# Patient Record
Sex: Female | Born: 1970
Health system: Southern US, Community
[De-identification: ages and names within clinical notes are randomized; demographics above are authoritative.]

## PROBLEM LIST (undated history)

## (undated) DIAGNOSIS — K8020 Calculus of gallbladder without cholecystitis without obstruction: Secondary | ICD-10-CM

## (undated) DIAGNOSIS — Z973 Presence of spectacles and contact lenses: Secondary | ICD-10-CM

## (undated) DIAGNOSIS — K8050 Calculus of bile duct without cholangitis or cholecystitis without obstruction: Secondary | ICD-10-CM

---
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
## 1996-05-11 HISTORY — PX: APPENDECTOMY: SHX54

## 1999-01-03 ENCOUNTER — Other Ambulatory Visit: Admission: RE | Admit: 1999-01-03 | Discharge: 1999-01-03 | Payer: Self-pay | Admitting: Obstetrics and Gynecology

## 2000-10-15 ENCOUNTER — Other Ambulatory Visit: Admission: RE | Admit: 2000-10-15 | Discharge: 2000-10-15 | Payer: Self-pay | Admitting: Obstetrics and Gynecology

## 2001-11-08 ENCOUNTER — Other Ambulatory Visit: Admission: RE | Admit: 2001-11-08 | Discharge: 2001-11-08 | Payer: Self-pay | Admitting: Obstetrics and Gynecology

## 2003-02-27 ENCOUNTER — Other Ambulatory Visit: Admission: RE | Admit: 2003-02-27 | Discharge: 2003-02-27 | Payer: Self-pay | Admitting: Obstetrics and Gynecology

## 2003-07-02 ENCOUNTER — Emergency Department (HOSPITAL_COMMUNITY): Admission: EM | Admit: 2003-07-02 | Discharge: 2003-07-02 | Payer: Self-pay | Admitting: Family Medicine

## 2003-08-09 ENCOUNTER — Emergency Department (HOSPITAL_COMMUNITY): Admission: EM | Admit: 2003-08-09 | Discharge: 2003-08-09 | Payer: Self-pay | Admitting: Family Medicine

## 2003-09-13 ENCOUNTER — Emergency Department (HOSPITAL_COMMUNITY): Admission: EM | Admit: 2003-09-13 | Discharge: 2003-09-13 | Payer: Self-pay | Admitting: Family Medicine

## 2004-01-08 ENCOUNTER — Emergency Department (HOSPITAL_COMMUNITY): Admission: EM | Admit: 2004-01-08 | Discharge: 2004-01-08 | Payer: Self-pay | Admitting: Family Medicine

## 2004-03-13 ENCOUNTER — Other Ambulatory Visit: Admission: RE | Admit: 2004-03-13 | Discharge: 2004-03-13 | Payer: Self-pay | Admitting: Obstetrics and Gynecology

## 2004-10-20 ENCOUNTER — Emergency Department (HOSPITAL_COMMUNITY): Admission: EM | Admit: 2004-10-20 | Discharge: 2004-10-20 | Payer: Self-pay | Admitting: Emergency Medicine

## 2005-02-19 ENCOUNTER — Inpatient Hospital Stay (HOSPITAL_COMMUNITY): Admission: AD | Admit: 2005-02-19 | Discharge: 2005-02-19 | Payer: Self-pay | Admitting: Obstetrics and Gynecology

## 2005-04-15 ENCOUNTER — Inpatient Hospital Stay (HOSPITAL_COMMUNITY): Admission: AD | Admit: 2005-04-15 | Discharge: 2005-04-17 | Payer: Self-pay | Admitting: Obstetrics and Gynecology

## 2005-04-18 ENCOUNTER — Encounter: Admission: RE | Admit: 2005-04-18 | Discharge: 2005-05-18 | Payer: Self-pay | Admitting: Obstetrics and Gynecology

## 2005-05-21 ENCOUNTER — Other Ambulatory Visit: Admission: RE | Admit: 2005-05-21 | Discharge: 2005-05-21 | Payer: Self-pay | Admitting: Obstetrics and Gynecology

## 2005-11-20 ENCOUNTER — Emergency Department (HOSPITAL_COMMUNITY): Admission: EM | Admit: 2005-11-20 | Discharge: 2005-11-20 | Payer: Self-pay | Admitting: Family Medicine

## 2006-03-29 ENCOUNTER — Emergency Department (HOSPITAL_COMMUNITY): Admission: EM | Admit: 2006-03-29 | Discharge: 2006-03-29 | Payer: Self-pay | Admitting: Family Medicine

## 2006-04-19 ENCOUNTER — Emergency Department (HOSPITAL_COMMUNITY): Admission: EM | Admit: 2006-04-19 | Discharge: 2006-04-19 | Payer: Self-pay | Admitting: Family Medicine

## 2007-01-14 ENCOUNTER — Emergency Department (HOSPITAL_COMMUNITY): Admission: EM | Admit: 2007-01-14 | Discharge: 2007-01-14 | Payer: Self-pay | Admitting: Family Medicine

## 2007-06-10 ENCOUNTER — Emergency Department (HOSPITAL_COMMUNITY): Admission: EM | Admit: 2007-06-10 | Discharge: 2007-06-10 | Payer: Self-pay | Admitting: Family Medicine

## 2009-08-12 ENCOUNTER — Emergency Department (HOSPITAL_COMMUNITY): Admission: EM | Admit: 2009-08-12 | Discharge: 2009-08-12 | Payer: Self-pay | Admitting: Emergency Medicine

## 2010-07-30 LAB — POCT RAPID STREP A (OFFICE): Streptococcus, Group A Screen (Direct): NEGATIVE

## 2010-09-26 NOTE — Op Note (Signed)
NAMEMARGERY, SZOSTAK NO.:  [id number]   MEDICAL RECORD NO.:  [id number]          PATIENT TYPE:  INP   LOCATION:  9114                          FACILITY:  [facility]   PHYSICIAN:  Juluis Mire, M.D.   DATE OF BIRTH:  1971/04/08   DATE OF PROCEDURE:  04/15/2005  DATE OF DISCHARGE:                                 OPERATIVE REPORT   DELIVERY NOTE:  The patient progressed to second stage.  She had a three-hour second stage  with the fetal vertex station at the perineum.  You can see the head with  pushing.  It was in the OA presentation.  The infant was having increasing  variable decelerations with slow return.  Decision was made to proceed with  the vacuum extractor assisted vaginal delivery.  Risks were discussed  including the risk of hematoma formation, possibly requiring transfusion,  rare risk of intracranial bleeds.  The bladder was emptied by  catheterization.  Kiwi vacuum extractor was put in place.  With the next  contraction, it was brought to appropriate pressure.  With that contraction,  we did have one pop-off.  With the next contraction, the VE was reapplied.  Appropriate suction obtained.  We brought the head down to the crowning  position.  The VE was removed.  The patient subsequently spontaneously  delivered the vertex.  The infant was a viable female with Apgars of 8 and 9.  PH is pending.  There was no episiotomy or tear.  Placenta was delivered  intact.  There was a three-vessel cord.  Estimated blood loss was 400 mL.  Mother did have an epidural in place. Mother and baby were doing well  postpartum.      Juluis Mire, M.D.  Electronically Signed     JSM/MEDQ  D:  04/15/2005  T:  04/16/2005  Job:  [id number]

## 2011-01-23 ENCOUNTER — Other Ambulatory Visit: Payer: Self-pay | Admitting: Obstetrics and Gynecology

## 2011-01-23 DIAGNOSIS — R928 Other abnormal and inconclusive findings on diagnostic imaging of breast: Secondary | ICD-10-CM

## 2011-01-29 LAB — POCT RAPID STREP A: Streptococcus, Group A Screen (Direct): NEGATIVE

## 2011-02-02 ENCOUNTER — Ambulatory Visit
Admission: RE | Admit: 2011-02-02 | Discharge: 2011-02-02 | Disposition: A | Discharge status: Home / Self Care | Payer: 59 | Source: Ambulatory Visit | Attending: Obstetrics and Gynecology | Admitting: Obstetrics and Gynecology

## 2011-02-02 DIAGNOSIS — R928 Other abnormal and inconclusive findings on diagnostic imaging of breast: Secondary | ICD-10-CM

## 2011-12-03 ENCOUNTER — Other Ambulatory Visit: Payer: Self-pay | Admitting: Obstetrics and Gynecology

## 2011-12-03 DIAGNOSIS — N644 Mastodynia: Secondary | ICD-10-CM

## 2011-12-07 ENCOUNTER — Ambulatory Visit
Admission: RE | Admit: 2011-12-07 | Discharge: 2011-12-07 | Disposition: A | Discharge status: Home / Self Care | Payer: 59 | Source: Ambulatory Visit | Attending: Obstetrics and Gynecology | Admitting: Obstetrics and Gynecology

## 2011-12-07 DIAGNOSIS — N644 Mastodynia: Secondary | ICD-10-CM

## 2014-04-10 ENCOUNTER — Other Ambulatory Visit: Payer: Self-pay | Admitting: Obstetrics and Gynecology

## 2014-04-10 DIAGNOSIS — R928 Other abnormal and inconclusive findings on diagnostic imaging of breast: Secondary | ICD-10-CM

## 2014-04-25 ENCOUNTER — Ambulatory Visit
Admission: RE | Admit: 2014-04-25 | Discharge: 2014-04-25 | Disposition: A | Discharge status: Home / Self Care | Payer: 59 | Source: Ambulatory Visit | Attending: Obstetrics and Gynecology | Admitting: Obstetrics and Gynecology

## 2014-04-25 ENCOUNTER — Other Ambulatory Visit: Payer: Self-pay | Admitting: Obstetrics and Gynecology

## 2014-04-25 DIAGNOSIS — R928 Other abnormal and inconclusive findings on diagnostic imaging of breast: Secondary | ICD-10-CM

## 2015-02-20 ENCOUNTER — Other Ambulatory Visit: Payer: Self-pay

## 2015-02-20 DIAGNOSIS — Z1231 Encounter for screening mammogram for malignant neoplasm of breast: Secondary | ICD-10-CM

## 2015-04-03 ENCOUNTER — Ambulatory Visit
Admission: RE | Admit: 2015-04-03 | Discharge: 2015-04-03 | Disposition: A | Discharge status: Home / Self Care | Payer: 59 | Source: Ambulatory Visit

## 2015-04-03 DIAGNOSIS — Z1231 Encounter for screening mammogram for malignant neoplasm of breast: Secondary | ICD-10-CM

## 2015-05-17 DIAGNOSIS — Z6822 Body mass index (BMI) 22.0-22.9, adult: Secondary | ICD-10-CM | POA: Diagnosis not present

## 2015-05-17 DIAGNOSIS — Z1212 Encounter for screening for malignant neoplasm of rectum: Secondary | ICD-10-CM | POA: Diagnosis not present

## 2015-05-17 DIAGNOSIS — Z01419 Encounter for gynecological examination (general) (routine) without abnormal findings: Secondary | ICD-10-CM | POA: Diagnosis not present

## 2016-01-23 DIAGNOSIS — Z131 Encounter for screening for diabetes mellitus: Secondary | ICD-10-CM | POA: Diagnosis not present

## 2016-01-23 DIAGNOSIS — Z1329 Encounter for screening for other suspected endocrine disorder: Secondary | ICD-10-CM | POA: Diagnosis not present

## 2016-01-23 DIAGNOSIS — Z1321 Encounter for screening for nutritional disorder: Secondary | ICD-10-CM | POA: Diagnosis not present

## 2016-01-23 DIAGNOSIS — Z13220 Encounter for screening for lipoid disorders: Secondary | ICD-10-CM | POA: Diagnosis not present

## 2016-03-31 ENCOUNTER — Other Ambulatory Visit: Payer: Self-pay | Admitting: Obstetrics and Gynecology

## 2016-03-31 DIAGNOSIS — Z1231 Encounter for screening mammogram for malignant neoplasm of breast: Secondary | ICD-10-CM

## 2016-04-10 ENCOUNTER — Ambulatory Visit (INDEPENDENT_AMBULATORY_CARE_PROVIDER_SITE_OTHER): Payer: 59 | Admitting: Physician Assistant

## 2016-04-10 ENCOUNTER — Encounter: Payer: Self-pay | Admitting: Physician Assistant

## 2016-04-10 ENCOUNTER — Other Ambulatory Visit (INDEPENDENT_AMBULATORY_CARE_PROVIDER_SITE_OTHER): Payer: 59

## 2016-04-10 ENCOUNTER — Encounter (INDEPENDENT_AMBULATORY_CARE_PROVIDER_SITE_OTHER): Payer: Self-pay

## 2016-04-10 VITALS — BP 116/68 | HR 62 | Ht 66.0 in | Wt 144.0 lb

## 2016-04-10 DIAGNOSIS — R1011 Right upper quadrant pain: Secondary | ICD-10-CM | POA: Diagnosis not present

## 2016-04-10 LAB — CBC WITH DIFFERENTIAL/PLATELET
Basophils Absolute: 0 10*3/uL (ref 0.0–0.1)
Basophils Relative: 0.3 % (ref 0.0–3.0)
Eosinophils Absolute: 0.2 10*3/uL (ref 0.0–0.7)
Eosinophils Relative: 2 % (ref 0.0–5.0)
HCT: 41.3 % (ref 36.0–46.0)
Hemoglobin: 14.3 g/dL (ref 12.0–15.0)
Lymphocytes Relative: 27.9 % (ref 12.0–46.0)
Lymphs Abs: 2.3 10*3/uL (ref 0.7–4.0)
MCHC: 34.6 g/dL (ref 30.0–36.0)
MCV: 91.8 fl (ref 78.0–100.0)
Monocytes Absolute: 0.5 10*3/uL (ref 0.1–1.0)
Monocytes Relative: 5.9 % (ref 3.0–12.0)
Neutro Abs: 5.3 10*3/uL (ref 1.4–7.7)
Neutrophils Relative %: 63.9 % (ref 43.0–77.0)
Platelets: 196 10*3/uL (ref 150.0–400.0)
RBC: 4.49 Mil/uL (ref 3.87–5.11)
RDW: 12.6 % (ref 11.5–15.5)
WBC: 8.3 10*3/uL (ref 4.0–10.5)

## 2016-04-10 LAB — COMPREHENSIVE METABOLIC PANEL
ALT: 19 U/L (ref 0–35)
AST: 14 U/L (ref 0–37)
Albumin: 4 g/dL (ref 3.5–5.2)
Alkaline Phosphatase: 54 U/L (ref 39–117)
BUN: 18 mg/dL (ref 6–23)
CO2: 26 mEq/L (ref 19–32)
Calcium: 9.4 mg/dL (ref 8.4–10.5)
Chloride: 105 mEq/L (ref 96–112)
Creatinine, Ser: 0.68 mg/dL (ref 0.40–1.20)
GFR: 98.38 mL/min (ref >60.00)
Glucose, Bld: 88 mg/dL (ref 70–99)
Potassium: 3.8 mEq/L (ref 3.5–5.1)
Sodium: 139 mEq/L (ref 135–145)
Total Bilirubin: 0.4 mg/dL (ref 0.2–1.2)
Total Protein: 7 g/dL (ref 6.0–8.3)

## 2016-04-10 LAB — LIPASE: Lipase: 19 U/L (ref 11.0–59.0)

## 2016-04-10 NOTE — Progress Notes (Signed)
Chief Complaint: Intermittent RUQ Pain  HPI:  Mrs. Si Gaul is a 45 year old Caucasian female who presents to clinic today accompanied by her husband. She tells me that she followed with Dr. Olevia Perches in the past, "many years ago". She tells me that she does not have a lot of medical problems and "isn't a complainer", but has been suffering from intermittent right upper quadrant pain now on 4 separate occasions over the past 4 months. The patient tells me initially this occurred 4 months ago and woke her up in the middle the night around 2 AM. She describes this was a right upper quadrant pain initially rated as a 6-7/ 10 which after an hour or so started to radiate into her back and up her right shoulder blade. This was very uncomfortable, the patient tried to reposition but that this did not change anything. She tried taking ibuprofen but this also did not help. This lasted for 2-3 hours and gradually went away and she went back to sleep. This occurred again about 2 months ago in the middle of the night, exactly the same as the first episode. This then occurred a week ago Saturday in the middle of the night and then again on Friday night. Friday night's episode was slightly different than previous. The patient tells me this started around 9 PM while she was still awake. They had celebrated thanksgiving that day so she had quite a lot to eat in the afternoon, but this was around 1 PM. The patient again tried ibuprofen which did not help. She then tried taking some ranitidine which did not help. Her husband began to research on the Internet what this could be and thought it could be gallbladder. He read that apple cider vinegar and a heating pad may help. The patient drank a glass of her sons apple juice and placed a heating pad on her right side and laid on that side and after about 15-20 minutes this pain went away. Patient describes this is a 6-7 /10 pain which is nagging/cramping in nature. They are both  concerned that she is having "gallbladder attacks".   Patient denies fever, chills, blood in her stool, melena, change in diet, weight loss, fatigue, anorexia, nausea, vomiting, heartburn, reflux or increase in gas or bloating.  Past Medical History: none  Past Surgical History:  Procedure Laterality Date  . APPENDECTOMY  1998  . VAGINAL DELIVERY    . WISDOM TOOTH EXTRACTION  1985    Current Outpatient Prescriptions  Medication Sig Dispense Refill  . Ascorbic Acid (VITAMIN C) 1000 MG tablet Take 1,000 mg by mouth daily.    . calcium-vitamin D (OSCAL WITH D) 250-125 MG-UNIT tablet Take 1 tablet by mouth daily.    Marland Kitchen glucosamine-chondroitin 500-400 MG tablet Take 1 tablet by mouth daily.    . norethindrone-ethinyl estradiol (JUNEL FE,GILDESS FE,LOESTRIN FE) 1-20 MG-MCG tablet Take 1 tablet by mouth daily.    . Potassium Gluconate 550 (90 K) MG TABS Take 1 capsule by mouth daily.    . vitamin E 400 UNIT capsule Take 400 Units by mouth daily.     No current facility-administered medications for this visit.     Allergies as of 04/10/2016  . (No Known Allergies)    Family History  Problem Relation Age of Onset  . Adopted: Yes    Social History   Social History  . Marital status: Married    Spouse name: N/A  . Number of children: N/A  . Years of  education: N/A   Occupational History  . [employer] employee    Social History Main Topics  . Smoking status: Never Smoker  . Smokeless tobacco: Never Used  . Alcohol use Yes     Comment: socially  . Drug use: No  . Sexual activity: Not on file   Other Topics Concern  . Not on file   Social History Narrative  . No narrative on file    Review of Systems:     Constitutional: No weight loss, fever, chills, weakness or fatigue HEENT: Eyes: No change in vision               Ears, Nose, Throat:  No change in hearing  Skin: No rash  Cardiovascular: No chest pain Respiratory: No SOB  Gastrointestinal: See HPI and otherwise  negative Neurological: No headache Musculoskeletal: Chronic back pain No new muscle or joint pain Hematologic: No bleeding  Psychiatric: No history of depression or anxiety   Physical Exam:  Vital signs: BP 116/68   Pulse 62   Ht 5\' 6"  (1.676 m)   Wt 144 lb (65.3 kg)   BMI 23.24 kg/m   Constitutional:   Pleasant Caucasian female appears to be in NAD, Well developed, Well nourished, alert and cooperative Head:  Normocephalic and atraumatic. Eyes:   PEERL, EOMI. No icterus. Conjunctiva pink. Ears:  Normal auditory acuity. Neck:  Supple Throat: Oral cavity and pharynx without inflammation, swelling or lesion.  Respiratory: Respirations even and unlabored. Lungs clear to auscultation bilaterally.   No wheezes, crackles, or rhonchi.  Cardiovascular: Normal S1, S2. No MRG. Regular rate and rhythm. No peripheral edema, cyanosis or pallor.  Gastrointestinal:  Soft, nondistended, nontender. No rebound or guarding. Normal bowel sounds. No appreciable masses or hepatomegaly. Rectal:  Not performed.  Msk:  Symmetrical without gross deformities. Without edema, no deformity or joint abnormality.  Neurologic:  Alert and  oriented x4;  grossly normal neurologically.  Skin:   Dry and intact without significant lesions or rashes. Psychiatric: Demonstrates good judgement and reason without abnormal affect or behaviors.   No RECENT LABS OR IMAGING:  Assessment: 1. Intermittent right upper quadrant pain: Patient describes 4 separate episodes which last 2-3 hours at a time of 7/10 right upper quadrant abdominal pain which will radiate up her right shoulder blade, does not seem to be brought on by eating, first 3 episodes woke the patient from sleep, the last one occurred at 9 PM after a Thanksgiving meal earlier that afternoon, no heartburn or reflux; consider gallbladder stones versus spasm versus biliary dyskinesia versus other  Plan: 1. Ordered right upper quadrant ultrasound. 2. Ordered labs to  include CBC, CMP and lipase 3. Discussed with the patient that she should eat small frequent meals throughout the day that are low in fat. 4. If the patient has another acute attack, she can try apple juice and a heating pad as this seemed to help her last time, though there is no true research behind this anecdote. 5. Advised the patient that if she has pain which lasted longer than normal or is more severe or has nausea or vomiting, would recommend that she proceed to the ER. 6. Patient will follow with Dr. Fuller Plan (as she requested him) or myself within the next 2-3 weeks or sooner if necessary.  Ellouise Newer, PA-C [facility] 04/10/2016, 3:18 PM

## 2016-04-10 NOTE — Patient Instructions (Signed)
You have been scheduled for an abdominal ultrasound at [facility] [address] [place], [place] [id number]   Make certain not to have anything to eat or drink 6 hours prior to your appointment. Should you need to reschedule your appointment, please contact radiology at [phone]. This test typically takes about 30 minutes to perform.  Your physician has requested that you go to the basement for the following lab work before leaving today:

## 2016-04-10 NOTE — Progress Notes (Signed)
Reviewed and agree with initial management plan.  Aaliyah Cancro T. Heath Tesler, MD FACG 

## 2016-04-11 ENCOUNTER — Encounter (HOSPITAL_BASED_OUTPATIENT_CLINIC_OR_DEPARTMENT_OTHER): Payer: Self-pay | Admitting: Radiology

## 2016-04-11 ENCOUNTER — Ambulatory Visit (HOSPITAL_BASED_OUTPATIENT_CLINIC_OR_DEPARTMENT_OTHER)
Admission: RE | Admit: 2016-04-11 | Discharge: 2016-04-11 | Disposition: A | Discharge status: Home / Self Care | Payer: 59 | Source: Ambulatory Visit | Attending: Physician Assistant | Admitting: Physician Assistant

## 2016-04-11 DIAGNOSIS — K8020 Calculus of gallbladder without cholecystitis without obstruction: Secondary | ICD-10-CM | POA: Insufficient documentation

## 2016-04-11 DIAGNOSIS — R1011 Right upper quadrant pain: Secondary | ICD-10-CM | POA: Diagnosis not present

## 2016-04-21 DIAGNOSIS — H524 Presbyopia: Secondary | ICD-10-CM | POA: Diagnosis not present

## 2016-04-21 DIAGNOSIS — H5213 Myopia, bilateral: Secondary | ICD-10-CM | POA: Diagnosis not present

## 2016-04-24 ENCOUNTER — Ambulatory Visit: Payer: Self-pay | Admitting: General Surgery

## 2016-04-24 DIAGNOSIS — K8020 Calculus of gallbladder without cholecystitis without obstruction: Secondary | ICD-10-CM | POA: Diagnosis not present

## 2016-05-07 ENCOUNTER — Encounter (HOSPITAL_BASED_OUTPATIENT_CLINIC_OR_DEPARTMENT_OTHER): Payer: Self-pay | Admitting: *Deleted

## 2016-05-08 ENCOUNTER — Ambulatory Visit: Payer: Self-pay | Admitting: General Surgery

## 2016-05-08 ENCOUNTER — Ambulatory Visit: Payer: 59

## 2016-05-08 ENCOUNTER — Encounter (HOSPITAL_BASED_OUTPATIENT_CLINIC_OR_DEPARTMENT_OTHER): Payer: Self-pay | Admitting: *Deleted

## 2016-05-08 NOTE — Progress Notes (Signed)
NPO AFTER MN W/ EXCEPTION CLEAR LIQUIDS UNTIL 0700 (NO CREAM/ MILK PRODUCTS).  ARRIVE AT 1100.  NEEDS HG AND URINE PREG.  PRE-OP ORDERS PENDING.  WILL TAKE AM MED DOS W/ SIPS OF WATER.  WILL DO HIBICLENS SHOWER  HS BEFORE AND AM DOS.

## 2016-05-15 ENCOUNTER — Ambulatory Visit (HOSPITAL_BASED_OUTPATIENT_CLINIC_OR_DEPARTMENT_OTHER): Payer: 59 | Admitting: Anesthesiology

## 2016-05-15 ENCOUNTER — Ambulatory Visit (HOSPITAL_BASED_OUTPATIENT_CLINIC_OR_DEPARTMENT_OTHER)
Admission: RE | Admit: 2016-05-15 | Discharge: 2016-05-15 | Disposition: A | Discharge status: Home / Self Care | Payer: 59 | Source: Ambulatory Visit | Attending: General Surgery | Admitting: General Surgery

## 2016-05-15 ENCOUNTER — Encounter (HOSPITAL_BASED_OUTPATIENT_CLINIC_OR_DEPARTMENT_OTHER): Payer: Self-pay

## 2016-05-15 ENCOUNTER — Encounter (HOSPITAL_BASED_OUTPATIENT_CLINIC_OR_DEPARTMENT_OTHER)
Admission: RE | Disposition: A | Discharge status: Home / Self Care | Payer: Self-pay | Source: Ambulatory Visit | Attending: General Surgery

## 2016-05-15 DIAGNOSIS — K8010 Calculus of gallbladder with chronic cholecystitis without obstruction: Secondary | ICD-10-CM | POA: Diagnosis not present

## 2016-05-15 DIAGNOSIS — Z79899 Other long term (current) drug therapy: Secondary | ICD-10-CM | POA: Diagnosis not present

## 2016-05-15 DIAGNOSIS — K811 Chronic cholecystitis: Secondary | ICD-10-CM | POA: Diagnosis not present

## 2016-05-15 HISTORY — PX: CHOLECYSTECTOMY: SHX55

## 2016-05-15 HISTORY — DX: Calculus of gallbladder without cholecystitis without obstruction: K80.20

## 2016-05-15 HISTORY — DX: Presence of spectacles and contact lenses: Z97.3

## 2016-05-15 HISTORY — DX: Calculus of bile duct without cholangitis or cholecystitis without obstruction: K80.50

## 2016-05-15 LAB — CBC WITH DIFFERENTIAL/PLATELET
BASOS ABS: 0 10*3/uL (ref 0.0–0.1)
Basophils Relative: 0 %
Eosinophils Absolute: 0.2 10*3/uL (ref 0.0–0.7)
Eosinophils Relative: 3 %
HEMATOCRIT: 42 % (ref 36.0–46.0)
Hemoglobin: 14.3 g/dL (ref 12.0–15.0)
LYMPHS PCT: 26 %
Lymphs Abs: 1.7 10*3/uL (ref 0.7–4.0)
MCH: 31 pg (ref 26.0–34.0)
MCHC: 34 g/dL (ref 30.0–36.0)
MCV: 90.9 fL (ref 78.0–100.0)
Monocytes Absolute: 0.5 10*3/uL (ref 0.1–1.0)
Monocytes Relative: 8 %
NEUTROS ABS: 4.2 10*3/uL (ref 1.7–7.7)
NEUTROS PCT: 63 %
Platelets: 215 10*3/uL (ref 150–400)
RBC: 4.62 MIL/uL (ref 3.87–5.11)
RDW: 12.7 % (ref 11.5–15.5)
WBC: 6.7 10*3/uL (ref 4.0–10.5)

## 2016-05-15 LAB — COMPREHENSIVE METABOLIC PANEL
ALBUMIN: 3.8 g/dL (ref 3.5–5.0)
ALT: 22 U/L (ref 14–54)
AST: 23 U/L (ref 15–41)
Alkaline Phosphatase: 55 U/L (ref 38–126)
Anion gap: 7 (ref 5–15)
BILIRUBIN TOTAL: 0.6 mg/dL (ref 0.3–1.2)
BUN: 16 mg/dL (ref 6–20)
CHLORIDE: 108 mmol/L (ref 101–111)
CO2: 23 mmol/L (ref 22–32)
Calcium: 9 mg/dL (ref 8.9–10.3)
Creatinine, Ser: 0.66 mg/dL (ref 0.44–1.00)
GFR calc Af Amer: >60 mL/min (ref >60)
GLUCOSE: 89 mg/dL (ref 65–99)
Potassium: 3.7 mmol/L (ref 3.5–5.1)
Sodium: 138 mmol/L (ref 135–145)
TOTAL PROTEIN: 7.2 g/dL (ref 6.5–8.1)

## 2016-05-15 LAB — POCT PREGNANCY, URINE: PREG TEST UR: NEGATIVE

## 2016-05-15 SURGERY — LAPAROSCOPIC CHOLECYSTECTOMY
Anesthesia: General | Site: Abdomen

## 2016-05-15 MED ORDER — MEPERIDINE HCL 25 MG/ML IJ SOLN
6.2500 mg | INTRAMUSCULAR | Status: DC | PRN
  Filled 2016-05-15: qty 1

## 2016-05-15 MED ORDER — DEXAMETHASONE SODIUM PHOSPHATE 4 MG/ML IJ SOLN
INTRAMUSCULAR | Status: DC | PRN
  Administered 2016-05-15: 10 mg via INTRAVENOUS

## 2016-05-15 MED ORDER — IBUPROFEN 800 MG PO TABS: 800.0000 mg | ORAL_TABLET | Freq: Three times a day (TID) | ORAL | 0 refills | Status: AC | PRN

## 2016-05-15 MED ORDER — METOCLOPRAMIDE HCL 5 MG/ML IJ SOLN
10.0000 mg | Freq: Once | INTRAMUSCULAR | Status: DC | PRN
  Filled 2016-05-15: qty 2

## 2016-05-15 MED ORDER — ACETAMINOPHEN 500 MG PO TABS
ORAL_TABLET | ORAL | Status: AC
  Filled 2016-05-15: qty 2

## 2016-05-15 MED ORDER — GABAPENTIN 300 MG PO CAPS
ORAL_CAPSULE | ORAL | Status: AC
  Filled 2016-05-15: qty 1

## 2016-05-15 MED ORDER — HYDROCODONE-ACETAMINOPHEN 5-325 MG PO TABS: 1.0000 | ORAL_TABLET | Freq: Four times a day (QID) | ORAL | 0 refills | Status: DC | PRN

## 2016-05-15 MED ORDER — KETOROLAC TROMETHAMINE 30 MG/ML IJ SOLN
INTRAMUSCULAR | Status: AC
  Filled 2016-05-15: qty 1

## 2016-05-15 MED ORDER — MIDAZOLAM HCL 2 MG/2ML IJ SOLN
INTRAMUSCULAR | Status: AC
  Filled 2016-05-15: qty 2

## 2016-05-15 MED ORDER — ROCURONIUM BROMIDE 50 MG/5ML IV SOSY
PREFILLED_SYRINGE | INTRAVENOUS | Status: AC
  Filled 2016-05-15: qty 5

## 2016-05-15 MED ORDER — SUGAMMADEX SODIUM 200 MG/2ML IV SOLN
INTRAVENOUS | Status: AC
  Filled 2016-05-15: qty 2

## 2016-05-15 MED ORDER — METOCLOPRAMIDE HCL 5 MG/ML IJ SOLN
INTRAMUSCULAR | Status: AC
  Filled 2016-05-15: qty 2

## 2016-05-15 MED ORDER — FENTANYL CITRATE (PF) 100 MCG/2ML IJ SOLN
INTRAMUSCULAR | Status: AC
  Filled 2016-05-15: qty 4

## 2016-05-15 MED ORDER — CELECOXIB 400 MG PO CAPS
400.0000 mg | ORAL_CAPSULE | ORAL | Status: AC
  Administered 2016-05-15: 400 mg via ORAL
  Filled 2016-05-15: qty 1

## 2016-05-15 MED ORDER — ACETAMINOPHEN 500 MG PO TABS
1000.0000 mg | ORAL_TABLET | ORAL | Status: AC
  Administered 2016-05-15: 1000 mg via ORAL
  Filled 2016-05-15: qty 2

## 2016-05-15 MED ORDER — PROPOFOL 10 MG/ML IV BOLUS
INTRAVENOUS | Status: DC | PRN
  Administered 2016-05-15: 170 mg via INTRAVENOUS

## 2016-05-15 MED ORDER — DEXAMETHASONE SODIUM PHOSPHATE 10 MG/ML IJ SOLN
INTRAMUSCULAR | Status: AC
Start: 2016-05-15 — End: 2016-05-15
  Filled 2016-05-15: qty 1

## 2016-05-15 MED ORDER — SUGAMMADEX SODIUM 200 MG/2ML IV SOLN
INTRAVENOUS | Status: DC | PRN
Start: 2016-05-15 — End: 2016-05-15
  Administered 2016-05-15: 100 mg via INTRAVENOUS
  Administered 2016-05-15: 200 mg via INTRAVENOUS

## 2016-05-15 MED ORDER — SCOPOLAMINE 1 MG/3DAYS TD PT72
1.0000 | MEDICATED_PATCH | TRANSDERMAL | Status: DC
  Administered 2016-05-15: 1.5 mg via TRANSDERMAL
  Filled 2016-05-15: qty 1

## 2016-05-15 MED ORDER — HYDROMORPHONE HCL 1 MG/ML IJ SOLN
0.2500 mg | INTRAMUSCULAR | Status: DC | PRN
  Filled 2016-05-15: qty 0.5

## 2016-05-15 MED ORDER — FENTANYL CITRATE (PF) 100 MCG/2ML IJ SOLN
INTRAMUSCULAR | Status: DC | PRN
  Administered 2016-05-15: 100 ug via INTRAVENOUS

## 2016-05-15 MED ORDER — CHLORHEXIDINE GLUCONATE CLOTH 2 % EX PADS
6.0000 | MEDICATED_PAD | Freq: Once | CUTANEOUS | Status: DC
  Filled 2016-05-15: qty 6

## 2016-05-15 MED ORDER — ONDANSETRON HCL 4 MG/2ML IJ SOLN
INTRAMUSCULAR | Status: AC
  Filled 2016-05-15: qty 2

## 2016-05-15 MED ORDER — LACTATED RINGERS IV SOLN
INTRAVENOUS | Status: DC
  Administered 2016-05-15 (×2): via INTRAVENOUS
  Filled 2016-05-15: qty 1000

## 2016-05-15 MED ORDER — CEFOTETAN DISODIUM-DEXTROSE 2-2.08 GM-% IV SOLR
INTRAVENOUS | Status: AC
  Filled 2016-05-15: qty 50

## 2016-05-15 MED ORDER — METOCLOPRAMIDE HCL 5 MG/ML IJ SOLN
INTRAMUSCULAR | Status: DC | PRN
  Administered 2016-05-15: 10 mg via INTRAVENOUS

## 2016-05-15 MED ORDER — MIDAZOLAM HCL 5 MG/5ML IJ SOLN
INTRAMUSCULAR | Status: DC | PRN
  Administered 2016-05-15: 2 mg via INTRAVENOUS

## 2016-05-15 MED ORDER — LIDOCAINE 2% (20 MG/ML) 5 ML SYRINGE
INTRAMUSCULAR | Status: DC | PRN
  Administered 2016-05-15: 100 mg via INTRAVENOUS

## 2016-05-15 MED ORDER — LIDOCAINE 2% (20 MG/ML) 5 ML SYRINGE
INTRAMUSCULAR | Status: AC
  Filled 2016-05-15: qty 5

## 2016-05-15 MED ORDER — GABAPENTIN 300 MG PO CAPS
300.0000 mg | ORAL_CAPSULE | ORAL | Status: AC
  Administered 2016-05-15: 300 mg via ORAL
  Filled 2016-05-15: qty 1

## 2016-05-15 MED ORDER — PROPOFOL 10 MG/ML IV BOLUS
INTRAVENOUS | Status: AC
  Filled 2016-05-15: qty 40

## 2016-05-15 MED ORDER — ONDANSETRON HCL 4 MG/2ML IJ SOLN
INTRAMUSCULAR | Status: DC | PRN
  Administered 2016-05-15: 4 mg via INTRAVENOUS

## 2016-05-15 MED ORDER — SCOPOLAMINE 1 MG/3DAYS TD PT72
MEDICATED_PATCH | TRANSDERMAL | Status: AC
  Filled 2016-05-15: qty 1

## 2016-05-15 MED ORDER — HYDROCODONE-ACETAMINOPHEN 7.5-325 MG PO TABS
ORAL_TABLET | ORAL | Status: AC
  Filled 2016-05-15: qty 1

## 2016-05-15 MED ORDER — DEXTROSE 5 % IV SOLN
2.0000 g | INTRAVENOUS | Status: AC
  Administered 2016-05-15: 2 g via INTRAVENOUS
  Filled 2016-05-15: qty 2

## 2016-05-15 MED ORDER — HYDROCODONE-ACETAMINOPHEN 7.5-325 MG PO TABS
1.0000 | ORAL_TABLET | Freq: Once | ORAL | Status: AC | PRN
  Administered 2016-05-15: 1 via ORAL
  Filled 2016-05-15: qty 1

## 2016-05-15 MED ORDER — ROCURONIUM BROMIDE 50 MG/5ML IV SOSY
PREFILLED_SYRINGE | INTRAVENOUS | Status: DC | PRN
  Administered 2016-05-15: 40 mg via INTRAVENOUS

## 2016-05-15 MED ORDER — BUPIVACAINE-EPINEPHRINE 0.25% -1:200000 IJ SOLN
INTRAMUSCULAR | Status: DC | PRN
  Administered 2016-05-15: 30 mL

## 2016-05-15 MED ORDER — CELECOXIB 200 MG PO CAPS
ORAL_CAPSULE | ORAL | Status: AC
  Filled 2016-05-15: qty 2

## 2016-05-15 SURGICAL SUPPLY — 54 items
ADH SKN CLS APL DERMABOND .7 (GAUZE/BANDAGES/DRESSINGS) ×1
APPLIER CLIP ROT 10 11.4 M/L (STAPLE)
APR CLP MED LRG 11.4X10 (STAPLE)
BAG SPEC RTRVL 10 TROC 200 (ENDOMECHANICALS) ×1
BLADE SURG 11 STRL SS (BLADE) ×2 IMPLANT
CATH CHOLANG 76X19 KUMAR (CATHETERS) IMPLANT
CHLORAPREP W/TINT 26ML (MISCELLANEOUS) ×3 IMPLANT
CLIP APPLIE ROT 10 11.4 M/L (STAPLE) IMPLANT
CLIP LIGATING HEM O LOK PURPLE (MISCELLANEOUS) ×2 IMPLANT
CLIP LIGATING HEMO LOK XL GOLD (MISCELLANEOUS) IMPLANT
COVER BACK TABLE 60X90IN (DRAPES) ×2 IMPLANT
COVER MAYO STAND STRL (DRAPES) ×2 IMPLANT
DECANTER SPIKE VIAL GLASS SM (MISCELLANEOUS) ×2 IMPLANT
DERMABOND ADVANCED (GAUZE/BANDAGES/DRESSINGS) ×1
DERMABOND ADVANCED .7 DNX12 (GAUZE/BANDAGES/DRESSINGS) ×1 IMPLANT
DEVICE PMI PUNCTURE CLOSURE (MISCELLANEOUS) ×1 IMPLANT
DRAIN CHANNEL 19F RND (DRAIN) IMPLANT
DRAPE C-ARM 42X72 X-RAY (DRAPES) IMPLANT
DRAPE LAPAROSCOPIC ABDOMINAL (DRAPES) ×2 IMPLANT
DRAPE UTILITY XL STRL (DRAPES) ×2 IMPLANT
ELECT REM PT RETURN 9FT ADLT (ELECTROSURGICAL) ×2
ELECTRODE REM PT RTRN 9FT ADLT (ELECTROSURGICAL) ×1 IMPLANT
EVACUATOR SILICONE 100CC (DRAIN) IMPLANT
GLOVE BIO SURGEON STRL SZ 6.5 (GLOVE) ×1 IMPLANT
GLOVE BIOGEL PI IND STRL 6.5 (GLOVE) IMPLANT
GLOVE BIOGEL PI IND STRL 7.0 (GLOVE) ×1 IMPLANT
GLOVE BIOGEL PI INDICATOR 6.5 (GLOVE) ×1
GLOVE BIOGEL PI INDICATOR 7.0 (GLOVE) ×1
GLOVE SURG SS PI 7.0 STRL IVOR (GLOVE) ×2 IMPLANT
GOWN STRL REUS W/TWL LRG LVL3 (GOWN DISPOSABLE) ×6 IMPLANT
HEMOSTAT SNOW SURGICEL 2X4 (HEMOSTASIS) IMPLANT
IV NS 1000ML (IV SOLUTION) ×4
IV NS 1000ML BAXH (IV SOLUTION) ×2 IMPLANT
NEEDLE HYPO 22GX1.5 SAFETY (NEEDLE) ×2 IMPLANT
PACK BASIN DAY SURGERY FS (CUSTOM PROCEDURE TRAY) ×2 IMPLANT
POUCH RETRIEVAL ECOSAC 10 (ENDOMECHANICALS) ×1 IMPLANT
POUCH RETRIEVAL ECOSAC 10MM (ENDOMECHANICALS) ×1
RELOAD STAPLE 45 3.5 BLU ETS (ENDOMECHANICALS) IMPLANT
RELOAD STAPLE TA45 3.5 REG BLU (ENDOMECHANICALS) IMPLANT
SCISSORS LAP 5X35 DISP (ENDOMECHANICALS) IMPLANT
SET IRRIG TUBING LAPAROSCOPIC (IRRIGATION / IRRIGATOR) IMPLANT
SHEARS HARMONIC ACE PLUS 36CM (ENDOMECHANICALS) IMPLANT
SOLUTION ANTI FOG 6CC (MISCELLANEOUS) ×2 IMPLANT
STOPCOCK 4 WAY LG BORE MALE ST (IV SETS) IMPLANT
SUT ETHILON 2 0 PS N (SUTURE) IMPLANT
SUT MNCRL AB 4-0 PS2 18 (SUTURE) ×2 IMPLANT
SUT VICRYL 0 ENDOLOOP (SUTURE) IMPLANT
SUT VICRYL 0 UR6 27IN ABS (SUTURE) ×2 IMPLANT
SYR 20CC LL (SYRINGE) IMPLANT
SYR CONTROL 10ML LL (SYRINGE) ×2 IMPLANT
TOWEL OR 17X24 6PK STRL BLUE (TOWEL DISPOSABLE) ×4 IMPLANT
TROCAR BLADELESS OPT 5 100 (ENDOMECHANICALS) ×6 IMPLANT
TROCAR XCEL 12X100 BLDLESS (ENDOMECHANICALS) ×2 IMPLANT
TUBING INSUF HEATED (TUBING) ×2 IMPLANT

## 2016-05-15 NOTE — Transfer of Care (Signed)
  Last Vitals:  Vitals:   05/15/16 1113 05/15/16 1420  BP: (!) 143/91 (!) (P) 138/92  Pulse: 81   Resp: 16   Temp: 36.8 C (P) 36.6 C    Last Pain:  Vitals:   05/15/16 1113  TempSrc: Oral      Patients Stated Pain Goal: 5 (05/15/16 1149)  Immediate Anesthesia Transfer of Care Note  Patient: Linda Vance  Procedure(s) Performed: Procedure(s) (LRB): LAPAROSCOPIC CHOLECYSTECTOMY (N/A)  Patient Location: PACU  Anesthesia Type: General  Level of Consciousness: awake, alert  and oriented  Airway & Oxygen Therapy: Patient Spontanous Breathing and Patient connected to nasal cannula  oxygen  Post-op Assessment: Report given to PACU RN and Post -op Vital signs reviewed and stable  Post vital signs: Reviewed and stable  Complications: No apparent anesthesia complications

## 2016-05-15 NOTE — Anesthesia Procedure Notes (Signed)
Procedure Name: Intubation Date/Time: 05/15/2016 1:11 PM Performed by: Bethena Roys T Pre-anesthesia Checklist: Patient identified, Emergency Drugs available, Suction available and Patient being monitored Patient Re-evaluated:Patient Re-evaluated prior to inductionOxygen Delivery Method: Circle system utilized Preoxygenation: Pre-oxygenation with 100% oxygen Intubation Type: IV induction Ventilation: Mask ventilation without difficulty Laryngoscope Size: Mac and 3 Grade View: Grade I Tube type: Oral Number of attempts: 1 Airway Equipment and Method: Stylet and Oral airway Placement Confirmation: ETT inserted through vocal cords under direct vision,  positive ETCO2 and breath sounds checked- equal and bilateral Secured at: 21 cm Tube secured with: Tape Dental Injury: Teeth and Oropharynx as per pre-operative assessment

## 2016-05-15 NOTE — Anesthesia Preprocedure Evaluation (Addendum)
Anesthesia Evaluation  Patient identified by MRN, date of birth, ID band Patient awake    Reviewed: Allergy & Precautions, NPO status , Patient's Chart, lab work & pertinent test results  Airway Mallampati: II  TM Distance: >3 FB Neck ROM: Full    Dental no notable dental hx. (+) Teeth Intact   Pulmonary neg pulmonary ROS,    Pulmonary exam normal breath sounds clear to auscultation       Cardiovascular negative cardio ROS Normal cardiovascular exam Rhythm:Regular Rate:Normal     Neuro/Psych negative neurological ROS  negative psych ROS   GI/Hepatic Neg liver ROS, Cholelithiasis Biliary colic   Endo/Other  negative endocrine ROS  Renal/GU negative Renal ROS  negative genitourinary   Musculoskeletal negative musculoskeletal ROS (+)   Abdominal   Peds  Hematology negative hematology ROS (+)   Anesthesia Other Findings   Reproductive/Obstetrics negative OB ROS                            Anesthesia Physical Anesthesia Plan  ASA: I  Anesthesia Plan: General   Post-op Pain Management:    Induction: Intravenous  Airway Management Planned: Oral ETT  Additional Equipment:   Intra-op Plan:   Post-operative Plan: Extubation in OR  Informed Consent: I have reviewed the patients History and Physical, chart, labs and discussed the procedure including the risks, benefits and alternatives for the proposed anesthesia with the patient or authorized representative who has indicated his/her understanding and acceptance.   Dental advisory given  Plan Discussed with: CRNA, Anesthesiologist and Surgeon  Anesthesia Plan Comments:        Anesthesia Quick Evaluation

## 2016-05-15 NOTE — H&P (Signed)
Linda Vance is an 46 y.o. female.   Chief Complaint: biliary colic HPI: 46 yo female with epigastric pain consistent with gallbladder disease.  Past Medical History:  Diagnosis Date  . Biliary colic   . Cholelithiasis   . Wears contact lenses     Past Surgical History:  Procedure Laterality Date  . APPENDECTOMY  1998  . WISDOM TOOTH EXTRACTION  1985    Family History  Problem Relation Age of Onset  . Adopted: Yes   Social History:  reports that she has never smoked. She has never used smokeless tobacco. She reports that she drinks alcohol. She reports that she does not use drugs.  Allergies: No Known Allergies  Medications Prior to Admission  Medication Sig Dispense Refill  . Ascorbic Acid (VITAMIN C) 1000 MG tablet Take 1,000 mg by mouth daily.    . calcium-vitamin D (OSCAL WITH D) 250-125 MG-UNIT tablet Take 1 tablet by mouth daily.    Marland Kitchen glucosamine-chondroitin 500-400 MG tablet Take 1 tablet by mouth daily.    . norethindrone-ethinyl estradiol (JUNEL FE,GILDESS FE,LOESTRIN FE) 1-20 MG-MCG tablet Take 1 tablet by mouth every morning.     . Potassium Gluconate 550 (90 K) MG TABS Take 1 capsule by mouth daily.    . vitamin E 400 UNIT capsule Take 400 Units by mouth daily.      Results for orders placed or performed during the hospital encounter of 05/15/16 (from the past 48 hour(s))  Pregnancy, urine POC     Status: None   Collection Time: 05/15/16 11:57 AM  Result Value Ref Range   Preg Test, Ur NEGATIVE NEGATIVE    Comment:        THE SENSITIVITY OF THIS METHODOLOGY IS >24 mIU/mL   CBC WITH DIFFERENTIAL     Status: None   Collection Time: 05/15/16 12:00 PM  Result Value Ref Range   WBC 6.7 4.0 - 10.5 K/uL   RBC 4.62 3.87 - 5.11 MIL/uL   Hemoglobin 14.3 12.0 - 15.0 g/dL   HCT 42.0 36.0 - 46.0 %   MCV 90.9 78.0 - 100.0 fL   MCH 31.0 26.0 - 34.0 pg   MCHC 34.0 30.0 - 36.0 g/dL   RDW 12.7 11.5 - 15.5 %   Platelets 215 150 - 400 K/uL   Neutrophils Relative %  63 %   Neutro Abs 4.2 1.7 - 7.7 K/uL   Lymphocytes Relative 26 %   Lymphs Abs 1.7 0.7 - 4.0 K/uL   Monocytes Relative 8 %   Monocytes Absolute 0.5 0.1 - 1.0 K/uL   Eosinophils Relative 3 %   Eosinophils Absolute 0.2 0.0 - 0.7 K/uL   Basophils Relative 0 %   Basophils Absolute 0.0 0.0 - 0.1 K/uL  Comprehensive metabolic panel     Status: None   Collection Time: 05/15/16 12:00 PM  Result Value Ref Range   Sodium 138 135 - 145 mmol/L   Potassium 3.7 3.5 - 5.1 mmol/L   Chloride 108 101 - 111 mmol/L   CO2 23 22 - 32 mmol/L   Glucose, Bld 89 65 - 99 mg/dL   BUN 16 6 - 20 mg/dL   Creatinine, Ser 0.66 0.44 - 1.00 mg/dL   Calcium 9.0 8.9 - 10.3 mg/dL   Total Protein 7.2 6.5 - 8.1 g/dL   Albumin 3.8 3.5 - 5.0 g/dL   AST 23 15 - 41 U/L   ALT 22 14 - 54 U/L   Alkaline Phosphatase 55 38 -  126 U/L   Total Bilirubin 0.6 0.3 - 1.2 mg/dL   GFR calc non Af Amer >60 >60 mL/min   GFR calc Af Amer >60 >60 mL/min    Comment: (NOTE) The eGFR has been calculated using the CKD EPI equation. This calculation has not been validated in all clinical situations. eGFR's persistently <60 mL/min signify possible Chronic Kidney Disease.    Anion gap 7 5 - 15   No results found.  Review of Systems  Constitutional: Negative for chills and fever.  HENT: Negative for hearing loss.   Eyes: Negative for blurred vision and double vision.  Respiratory: Negative for cough and hemoptysis.   Cardiovascular: Negative for chest pain and palpitations.  Gastrointestinal: Negative for abdominal pain, nausea and vomiting.  Genitourinary: Negative for dysuria and urgency.  Musculoskeletal: Negative for myalgias and neck pain.  Skin: Negative for itching and rash.  Neurological: Negative for dizziness, tingling and headaches.  Endo/Heme/Allergies: Does not bruise/bleed easily.  Psychiatric/Behavioral: Negative for depression and suicidal ideas.    Blood pressure (!) 143/91, pulse 81, temperature 98.3 F (36.8 C),  temperature source Oral, resp. rate 16, height '5\' 6"'  (1.676 m), weight 64.9 kg (143 lb), last menstrual period 04/17/2016, SpO2 100 %. Physical Exam  Vitals reviewed. Constitutional: She is oriented to person, place, and time. She appears well-developed and well-nourished.  HENT:  Head: Normocephalic and atraumatic.  Eyes: Conjunctivae and EOM are normal. Pupils are equal, round, and reactive to light.  Neck: Normal range of motion. Neck supple.  Cardiovascular: Normal rate and regular rhythm.   Respiratory: Effort normal and breath sounds normal.  GI: Soft. Bowel sounds are normal. She exhibits no distension. There is no tenderness.  RUQ pain on exam  Musculoskeletal: Normal range of motion.  Neurological: She is alert and oriented to person, place, and time.  Skin: Skin is warm and dry.  Psychiatric: She has a normal mood and affect. Her behavior is normal.     Assessment/Plan 46 yo female with biliary colic -lap chole as outpatient  Mickeal Skinner, MD 05/15/2016, 1:01 PM

## 2016-05-15 NOTE — Anesthesia Postprocedure Evaluation (Signed)
Anesthesia Post Note  Patient: Linda Vance  Procedure(s) Performed: Procedure(s) (LRB): LAPAROSCOPIC CHOLECYSTECTOMY (N/A)  Patient location during evaluation: PACU Anesthesia Type: General Level of consciousness: sedated Pain management: pain level controlled Vital Signs Assessment: post-procedure vital signs reviewed and stable Respiratory status: spontaneous breathing and respiratory function stable Cardiovascular status: stable Anesthetic complications: no       Last Vitals:  Vitals:   05/15/16 1445 05/15/16 1515  BP: 126/87 120/83  Pulse: 60 (!) 53  Resp: 17 16  Temp:      Last Pain:  Vitals:   05/15/16 1420  TempSrc:   PainSc: Asleep                 Ersel Wadleigh DANIEL

## 2016-05-15 NOTE — Op Note (Signed)
PATIENT:  Linda Vance  46 y.o. female  PRE-OPERATIVE DIAGNOSIS:  chronic cholecystitis  POST-OPERATIVE DIAGNOSIS:  chronic cholecystitis  PROCEDURE:  Procedure(s): LAPAROSCOPIC CHOLECYSTECTOMY   SURGEON:  Surgeon(s): Arta Bruce Sakinah Rosamond, MD  ASSISTANT: none  ANESTHESIA:   local and general  Indications for procedure: [name] is a 46 y.o. female with symptoms of Abdominal pain and RUQ pain consistent with gallbladder disease, Confirmed by Ultrasound.  Description of procedure: The patient was brought into the operative suite, placed supine. Anesthesia was administered with endotracheal tube. Patient was strapped in place and foot board was secured. All pressure points were offloaded by foam padding. The patient was prepped and draped in the usual sterile fashion.  A small incision was made to the right of the umbilicus. A 41mm trocar was inserted into the peritoneal cavity with optical entry. Pneumoperitoneum was applied with high flow low pressure. 2 60mm trocars were placed in the RUQ. A 23mm trocar was placed in the subxiphoid space. All trocars sites were first anesthesized with 0.25% marcaine with epinephrine in the subcutaneous and preperitoneal layers. Next the patient was placed in reverse trendelenberg. The gallbladder had multiple adhesions to the omentum and duodenum, these were removed with blunt dissection and cautery.  The gallbladder was retracted cephalad and lateral. The peritoneum was reflected off the infundibulum working lateral to medial. The cystic duct and cystic artery were identified and further dissection revealed a critical view. The cystic duct and cystic artery were doubly clipped and ligated.   The gallbladder was removed off the liver bed with cautery. The Gallbladder was placed in a specimen bag. The gallbladder fossa was irrigated and hemostasis was applied with cautery. The gallbladder was removed via the 88mm trocar. The fascial defect was  closed with interrupted 0 vicryl suture via laparoscopic trans-fascial suture passer. Pneumoperitoneum was removed, all trocar were removed. All incisions were closed with 4-0 monocryl subcuticular stitch. The patient woke from anesthesia and was brought to PACU in stable condition. All counts were correct  Findings: inflamed gallbladder with 2 large stones  Specimen: gallbladder  Blood loss: Total I/O In: 700 [I.V.:700] Out: 10 [Blood:10] ml  Local anesthesia: 20 ml 0.25% marcaine with epinephrine  Complications: none  PLAN OF CARE: Discharge to home after PACU  PATIENT DISPOSITION:  PACU - hemodynamically stable.  Gurney Maxin, M.D. General, Bariatric, & Minimally Invasive Surgery [facility]

## 2016-05-15 NOTE — Discharge Instructions (Signed)
[place] After Refer to this sheet in the next few weeks. These instructions provide you with information about caring for yourself after your procedure. Your health care provider may also give you more specific instructions. Your treatment has been planned according to current medical practices, but problems sometimes occur. Call your health care provider if you have any problems or questions after your procedure. What can I expect after the procedure? After the procedure, it is common to have:  A sore throat.  Discomfort in your shoulder.  Mild discomfort or cramping in your abdomen.  Gas pains.  Pain or soreness in the area where the surgical cut (incision) was made.  A bloated feeling.  Tiredness.  Nausea.  Vomiting. Follow these instructions at home: Medicines  Take over-the-counter and prescription medicines only as told by your health care provider.  Do not take aspirin because it can cause bleeding.  Do not drive or operate heavy machinery while taking prescription pain medicine. Activity  Rest for the rest of the day.  Return to your normal activities as told by your health care provider. Ask your health care provider what activities are safe for you. Incision care  Follow instructions from your health care provider about how to take care of your incision. Make sure you:  Wash your hands with soap and water before you change your bandage (dressing). If soap and water are not available, use hand sanitizer.  Change your dressing as told by your health care provider.  Leave stitches (sutures) in place. They may need to stay in place for 2 weeks or longer.  Check your incision area every day for signs of infection. Check for:  More redness, swelling, or pain.  More fluid or blood.  Warmth.  Pus or a bad smell. Other Instructions  Do not take baths, swim, or use a hot tub until your health care provider approves. You may take showers.  Keep all  follow-up visits as told by your health care provider. This is important.  Have someone help you with your daily household tasks for the first few days. Contact a health care provider if:  You have more redness, swelling, or pain around your incision.  Your incision feels warm to the touch.  You have pus or a bad smell coming from your incision.  The edges of your incision break open after the sutures have been removed.  Your pain does not improve after 2-3 days.  You have a rash.  You repeatedly become dizzy or light-headed.  Your pain medicine is not helping.  You are constipated. Get help right away if:  You have a fever.  You faint.  You have increasing pain in your abdomen.  You have severe pain in one or both of your shoulders.  You have fluid or blood coming from your sutures or from your vagina.  You have shortness of breath or difficulty breathing.  You have chest pain or leg pain.  You have ongoing nausea, vomiting, or diarrhea. This information is not intended to replace advice given to you by your health care provider. Make sure you discuss any questions you have with your health care provider. Document Released: 11/14/2004 Document Revised: 09/30/2015 Document Reviewed: 04/07/2015 Elsevier Interactive Patient Education  2017 [facility] Instructions  Activity: Get plenty of rest for the remainder of the day. A responsible adult should stay with you for 24 hours following the procedure.  For the next 24 hours, DO NOT: -  Drive a car -Paediatric nurse -Drink alcoholic beverages -Take any medication unless instructed by your physician -Make any legal decisions or sign important papers.  Meals: Start with liquid foods such as gelatin or soup. Progress to regular foods as tolerated. Avoid greasy, spicy, heavy foods. If nausea and/or vomiting occur, drink only clear liquids until the nausea and/or vomiting subsides. Call your  physician if vomiting continues.  Special Instructions/Symptoms: Your throat may feel dry or sore from the anesthesia or the breathing tube placed in your throat during surgery. If this causes discomfort, gargle with warm salt water. The discomfort should disappear within 24 hours.  If you had a scopolamine patch placed behind your ear for the management of post- operative nausea and/or vomiting:  1. The medication in the patch is effective for 72 hours, after which it should be removed.  Wrap patch in a tissue and discard in the trash. Wash hands thoroughly with soap and water. 2. You may remove the patch earlier than 72 hours if you experience unpleasant side effects which may include dry mouth, dizziness or visual disturbances. 3. Avoid touching the patch. Wash your hands with soap and water after contact with the patch.

## 2016-05-18 ENCOUNTER — Encounter (HOSPITAL_BASED_OUTPATIENT_CLINIC_OR_DEPARTMENT_OTHER): Payer: Self-pay | Admitting: General Surgery

## 2016-05-29 ENCOUNTER — Ambulatory Visit: Payer: 59 | Admitting: Gastroenterology

## 2016-06-03 DIAGNOSIS — Z6823 Body mass index (BMI) 23.0-23.9, adult: Secondary | ICD-10-CM | POA: Diagnosis not present

## 2016-06-03 DIAGNOSIS — Z1321 Encounter for screening for nutritional disorder: Secondary | ICD-10-CM | POA: Diagnosis not present

## 2016-06-03 DIAGNOSIS — Z01419 Encounter for gynecological examination (general) (routine) without abnormal findings: Secondary | ICD-10-CM | POA: Diagnosis not present

## 2016-07-23 DIAGNOSIS — D2272 Melanocytic nevi of left lower limb, including hip: Secondary | ICD-10-CM | POA: Diagnosis not present

## 2016-07-23 DIAGNOSIS — L718 Other rosacea: Secondary | ICD-10-CM | POA: Diagnosis not present

## 2016-07-23 DIAGNOSIS — Z85828 Personal history of other malignant neoplasm of skin: Secondary | ICD-10-CM | POA: Diagnosis not present

## 2016-07-23 DIAGNOSIS — I788 Other diseases of capillaries: Secondary | ICD-10-CM | POA: Diagnosis not present

## 2016-07-23 DIAGNOSIS — D2239 Melanocytic nevi of other parts of face: Secondary | ICD-10-CM | POA: Diagnosis not present

## 2016-07-23 DIAGNOSIS — D1801 Hemangioma of skin and subcutaneous tissue: Secondary | ICD-10-CM | POA: Diagnosis not present

## 2016-07-23 DIAGNOSIS — D225 Melanocytic nevi of trunk: Secondary | ICD-10-CM | POA: Diagnosis not present

## 2016-07-23 DIAGNOSIS — L821 Other seborrheic keratosis: Secondary | ICD-10-CM | POA: Diagnosis not present

## 2016-07-23 DIAGNOSIS — L111 Transient acantholytic dermatosis [Grover]: Secondary | ICD-10-CM | POA: Diagnosis not present

## 2016-09-17 ENCOUNTER — Ambulatory Visit
Admission: RE | Admit: 2016-09-17 | Discharge: 2016-09-17 | Disposition: A | Discharge status: Home / Self Care | Payer: 59 | Source: Ambulatory Visit | Attending: Obstetrics and Gynecology | Admitting: Obstetrics and Gynecology

## 2016-09-17 DIAGNOSIS — Z1231 Encounter for screening mammogram for malignant neoplasm of breast: Secondary | ICD-10-CM

## 2016-09-18 ENCOUNTER — Other Ambulatory Visit: Payer: Self-pay | Admitting: Obstetrics and Gynecology

## 2016-09-18 DIAGNOSIS — R928 Other abnormal and inconclusive findings on diagnostic imaging of breast: Secondary | ICD-10-CM

## 2016-09-23 ENCOUNTER — Ambulatory Visit
Admission: RE | Admit: 2016-09-23 | Discharge: 2016-09-23 | Disposition: A | Discharge status: Home / Self Care | Payer: 59 | Source: Ambulatory Visit | Attending: Obstetrics and Gynecology | Admitting: Obstetrics and Gynecology

## 2016-09-23 DIAGNOSIS — R928 Other abnormal and inconclusive findings on diagnostic imaging of breast: Secondary | ICD-10-CM | POA: Diagnosis not present

## 2016-09-23 DIAGNOSIS — N6489 Other specified disorders of breast: Secondary | ICD-10-CM | POA: Diagnosis not present

## 2016-11-05 DIAGNOSIS — L820 Inflamed seborrheic keratosis: Secondary | ICD-10-CM | POA: Diagnosis not present

## 2016-11-05 DIAGNOSIS — D2339 Other benign neoplasm of skin of other parts of face: Secondary | ICD-10-CM | POA: Diagnosis not present

## 2016-11-05 DIAGNOSIS — Z85828 Personal history of other malignant neoplasm of skin: Secondary | ICD-10-CM | POA: Diagnosis not present

## 2017-03-29 ENCOUNTER — Encounter: Payer: 59 | Attending: Obstetrics and Gynecology | Admitting: Registered"

## 2017-03-29 ENCOUNTER — Encounter: Payer: Self-pay | Admitting: Registered"

## 2017-03-29 DIAGNOSIS — E639 Nutritional deficiency, unspecified: Secondary | ICD-10-CM | POA: Insufficient documentation

## 2017-03-29 NOTE — Progress Notes (Signed)
Medical Nutrition Therapy:  Appt start time: 9937 end time:  1205.   Assessment:  Primary concerns today: [employer] Employee. Pt reports she came primarily to receive her badge, but would also like to receive nutrition education. Pt reports she also has questions regarding how to encourage her 48 year old son to eat a wider variety of foods. Pt reports she does not like fruit and never has. She thinks it is mostly due to the texture of fruit.   Preferred Learning Style:   No preference indicated   Learning Readiness:   Ready  MEDICATIONS: See list.    DIETARY INTAKE:  Usual eating pattern includes 3 meals per day and maybe 1 snack per day. Meals eaten at home are eaten together as a family and without electronics present.   Everyday foods include 1 egg, 1 piece of toast, skim milk, Premier Protein shake with lunch when working.  Avoided foods include fruits due to texture, fish with the exception of shrimp, yogurt.    24-hr recall:  B ( AM): hot chocolate and crossiant from [employer] (usually has 1 egg, toast, skim milk for breakfast) Snk ( AM): None reported.  L ( PM): None reported.  Snk ( PM): Dr. Malachi Bonds  D ( 530-6 PM): spaghetti, skim milk  Snk ( PM): None reported.  Beverages: 2 cups skim milk, 1 soda, 32 oz of water  24-hr recall: Typical day  B ( AM): 1 egg, 1 piece white wheat toast, skim milk  Snk ( AM): Dr. Malachi Bonds L ( PM): sandwich, raw vegetables with dip, Premier Protein shake  Snk ( PM): None reported unless a piece of chocolate  D (PM): grilled chicken breast, salad, baked potato with butter and sometimes cheese (typical meal) Snk ( PM): None reported.  Beverages: 1 soda, 1 cup milk, Premier protein, typically 32 oz of water   Usual physical activity: 20-30 minutes of walking or other exercise at home around 3 days per week.   Progress Towards Goal(s):  In progress.   Nutritional Diagnosis:  NI-5.11.1 Predicted suboptimal nutrient intake As related to whole  grains.  As evidenced by pt's reported dietary recall and habits .    Intervention:  Nutrition counseling provided. Dietitian provided education on balanced nutrition and recommendations for regular physical activity. Discussed trying smoothies to include fruit in pt's diet. Dietitian discussed general tips for encouraging balanced eating in children as well as pt had many questions due to having a son with limited food acceptance. Discussed with pt that dietitian would be glad to schedule appointment with pt's son as well. Pt appeared agreeable to information/goals discussed.   Goals:   Make sure to get in three meals per day. Try to have balanced meals like the My Plate example (see handout). Try to include more vegetables, fruits, and whole grains at meals. To include some fruit recommend trying smoothies (see handout). Could include part of a smoothie with breakfast or as a snack if you get hungry.   Continue including physical activity in your regular routine.Try to include at least 30 minutes of physical activity 5 days each week or at least 150 minutes per week. Regular physical activity promotes overall health-including helping to reduce risk for heart disease and diabetes, promoting mental health, and helping [place] sleep better.   Teaching Method Utilized:  Visual Auditory  Handouts given during visit include:  Balanced plate with food list.   Healthy Snacks   Barriers to learning/adherence to lifestyle change: None indicated.  Demonstrated degree of understanding via:  Teach Back   Monitoring/Evaluation:  Dietary intake, exercise, and body weight prn.

## 2017-03-29 NOTE — Patient Instructions (Signed)
Make sure to get in three meals per day. Try to have balanced meals like the My Plate example (see handout). Try to include more vegetables, fruits, and whole grains at meals. To include some fruit recommend trying smoothies (see handout). Could include part of a smoothie with breakfast or as a snack if you get hungry.   Continue including physical activity in your regular routine.Try to include at least 30 minutes of physical activity 5 days each week or at least 150 minutes per week. Regular physical activity promotes overall health-including helping to reduce risk for heart disease and diabetes, promoting mental health, and helping [place] sleep better.

## 2017-04-27 DIAGNOSIS — H524 Presbyopia: Secondary | ICD-10-CM | POA: Diagnosis not present

## 2017-04-27 DIAGNOSIS — H5213 Myopia, bilateral: Secondary | ICD-10-CM | POA: Diagnosis not present

## 2017-06-02 DIAGNOSIS — Z13220 Encounter for screening for lipoid disorders: Secondary | ICD-10-CM | POA: Diagnosis not present

## 2017-06-02 DIAGNOSIS — Z1329 Encounter for screening for other suspected endocrine disorder: Secondary | ICD-10-CM | POA: Diagnosis not present

## 2017-06-02 DIAGNOSIS — Z13228 Encounter for screening for other metabolic disorders: Secondary | ICD-10-CM | POA: Diagnosis not present

## 2017-06-02 DIAGNOSIS — Z1321 Encounter for screening for nutritional disorder: Secondary | ICD-10-CM | POA: Diagnosis not present

## 2017-06-16 DIAGNOSIS — Z01419 Encounter for gynecological examination (general) (routine) without abnormal findings: Secondary | ICD-10-CM | POA: Diagnosis not present

## 2017-06-16 DIAGNOSIS — Z6823 Body mass index (BMI) 23.0-23.9, adult: Secondary | ICD-10-CM | POA: Diagnosis not present

## 2017-10-11 DIAGNOSIS — H16202 Unspecified keratoconjunctivitis, left eye: Secondary | ICD-10-CM | POA: Diagnosis not present

## 2017-11-17 ENCOUNTER — Other Ambulatory Visit: Payer: Self-pay | Admitting: Obstetrics and Gynecology

## 2017-11-17 DIAGNOSIS — Z1231 Encounter for screening mammogram for malignant neoplasm of breast: Secondary | ICD-10-CM

## 2017-12-09 ENCOUNTER — Ambulatory Visit: Payer: 59

## 2017-12-22 ENCOUNTER — Ambulatory Visit
Admission: RE | Admit: 2017-12-22 | Discharge: 2017-12-22 | Disposition: A | Discharge status: Home / Self Care | Payer: 59 | Source: Ambulatory Visit | Attending: Obstetrics and Gynecology | Admitting: Obstetrics and Gynecology

## 2017-12-22 DIAGNOSIS — Z1231 Encounter for screening mammogram for malignant neoplasm of breast: Secondary | ICD-10-CM

## 2018-04-26 DIAGNOSIS — H5213 Myopia, bilateral: Secondary | ICD-10-CM | POA: Diagnosis not present

## 2018-04-26 DIAGNOSIS — H524 Presbyopia: Secondary | ICD-10-CM | POA: Diagnosis not present

## 2018-06-16 DIAGNOSIS — Z1329 Encounter for screening for other suspected endocrine disorder: Secondary | ICD-10-CM | POA: Diagnosis not present

## 2018-06-16 DIAGNOSIS — Z1321 Encounter for screening for nutritional disorder: Secondary | ICD-10-CM | POA: Diagnosis not present

## 2018-06-16 DIAGNOSIS — Z13220 Encounter for screening for lipoid disorders: Secondary | ICD-10-CM | POA: Diagnosis not present

## 2018-06-16 DIAGNOSIS — Z13228 Encounter for screening for other metabolic disorders: Secondary | ICD-10-CM | POA: Diagnosis not present

## 2018-06-30 DIAGNOSIS — Z6824 Body mass index (BMI) 24.0-24.9, adult: Secondary | ICD-10-CM | POA: Diagnosis not present

## 2018-06-30 DIAGNOSIS — Z01419 Encounter for gynecological examination (general) (routine) without abnormal findings: Secondary | ICD-10-CM | POA: Diagnosis not present

## 2018-11-24 DIAGNOSIS — Z711 Person with feared health complaint in whom no diagnosis is made: Secondary | ICD-10-CM | POA: Diagnosis not present

## 2018-11-24 DIAGNOSIS — Z20828 Contact with and (suspected) exposure to other viral communicable diseases: Secondary | ICD-10-CM | POA: Diagnosis not present

## 2019-01-23 IMAGING — MG DIGITAL SCREENING BILATERAL MAMMOGRAM WITH TOMO AND CAD
8 series · 9 of 24 positions shown · non-contrast
Comparison: Previous exam(s).

CLINICAL DATA: Screening.

EXAM:
DIGITAL SCREENING BILATERAL MAMMOGRAM WITH TOMO AND CAD

[L MLO synth-2D]
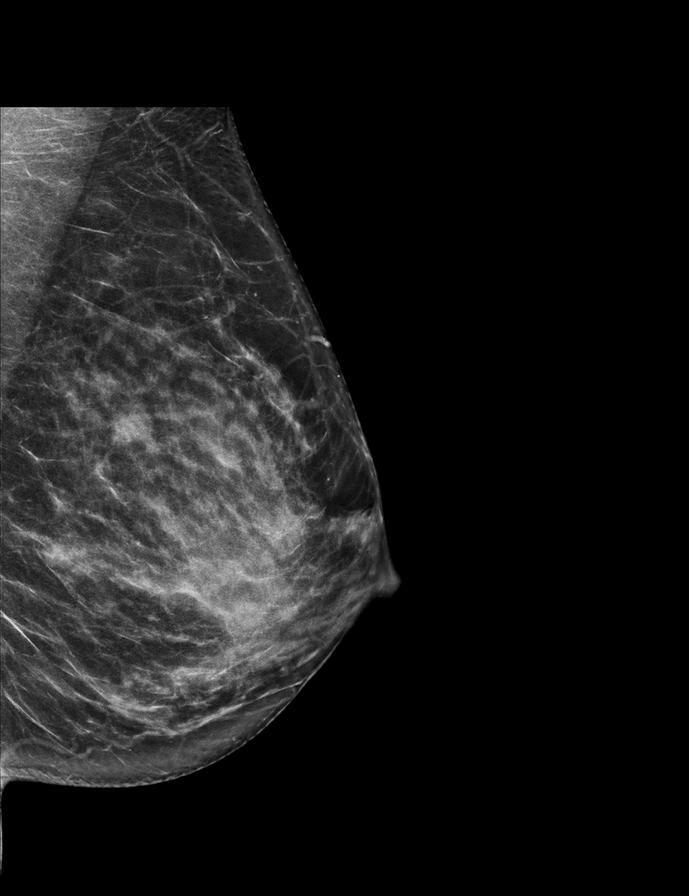

[R CC synth-2D]
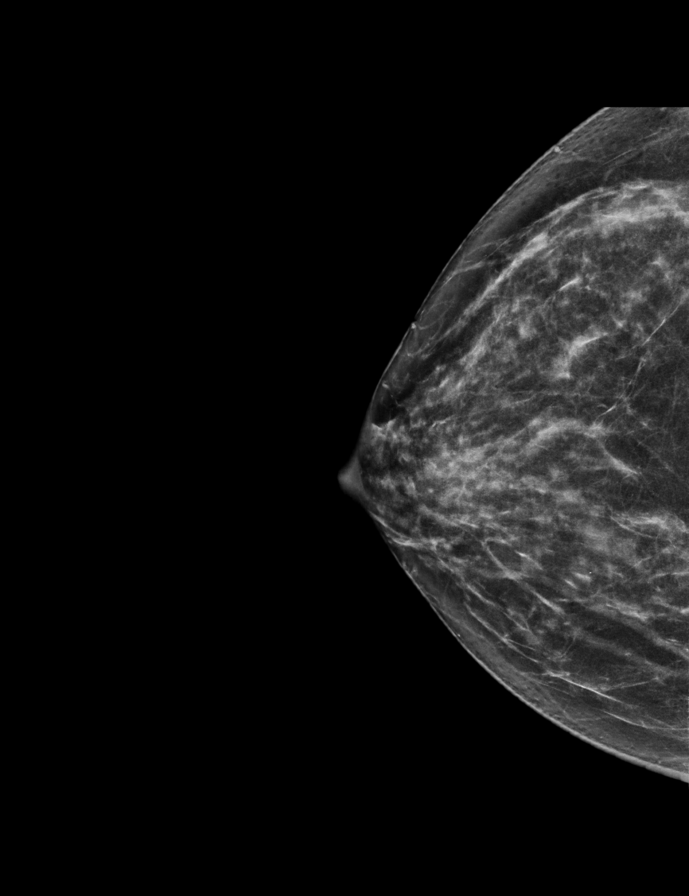

[R MLO synth-2D]
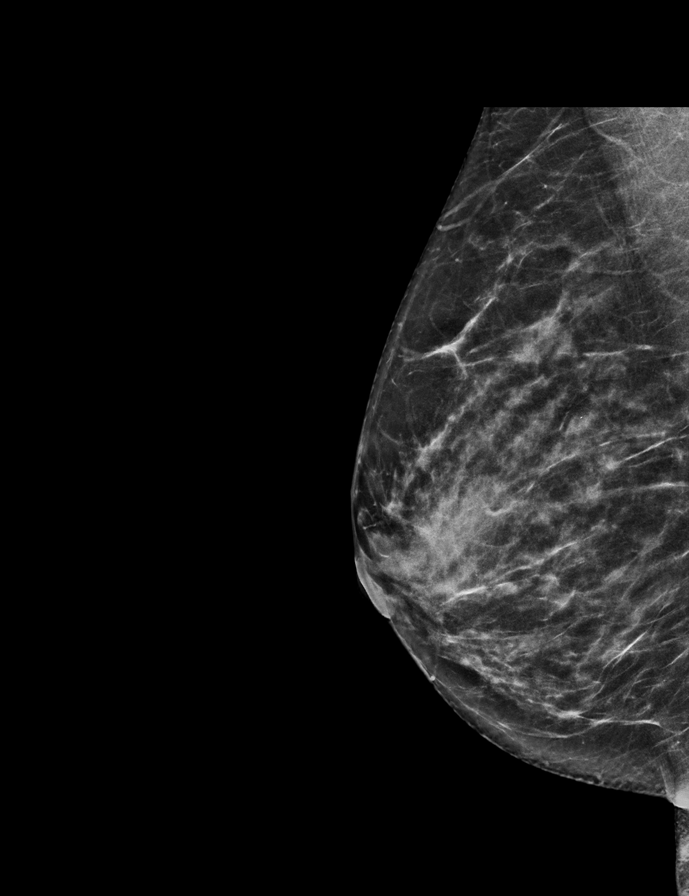

[L CC synth-2D]
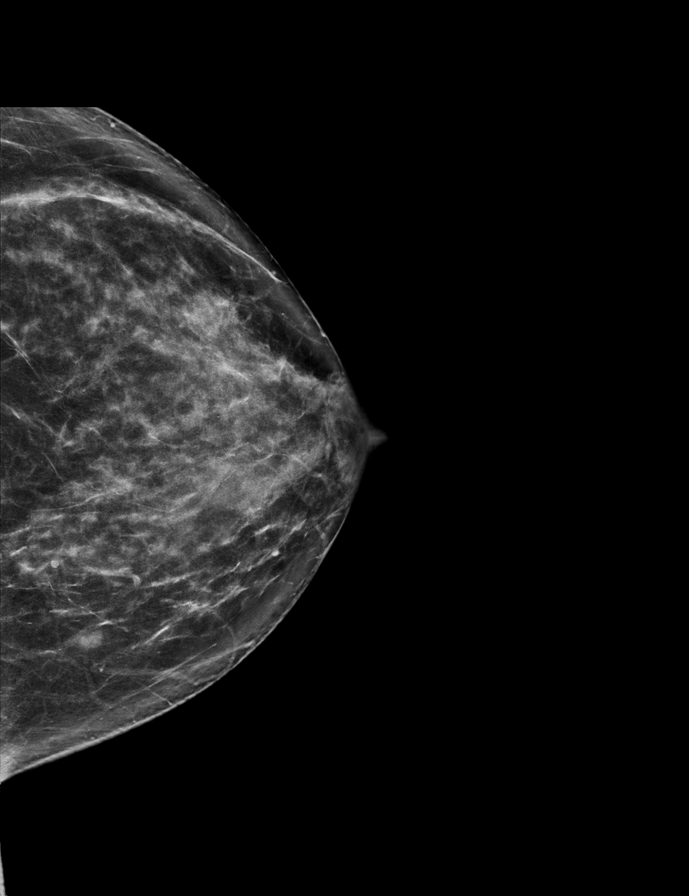

[L CC tomo · 2 of 68 frames shown]
[frame 22/68]
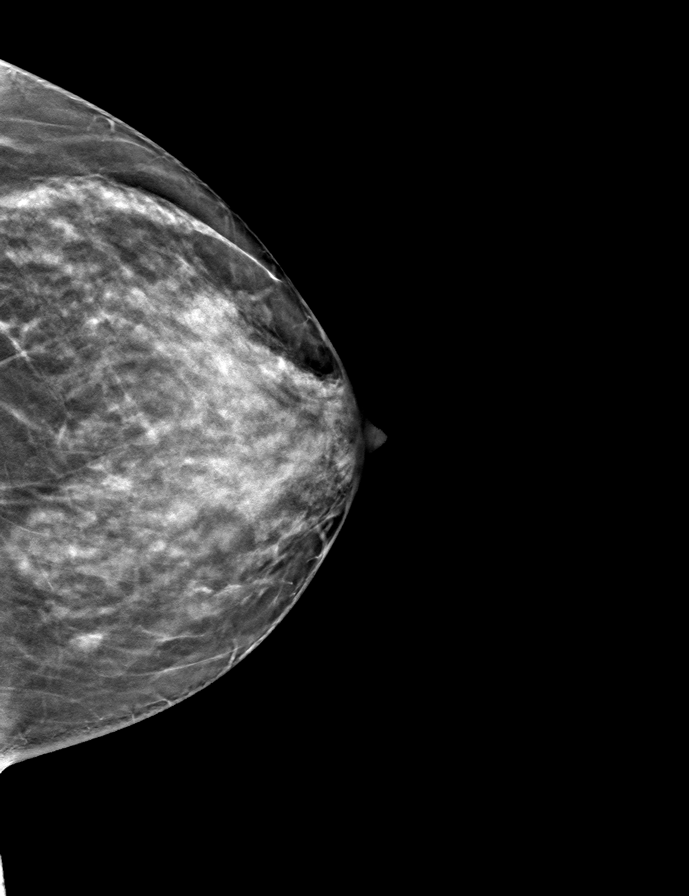
[frame 35/68]
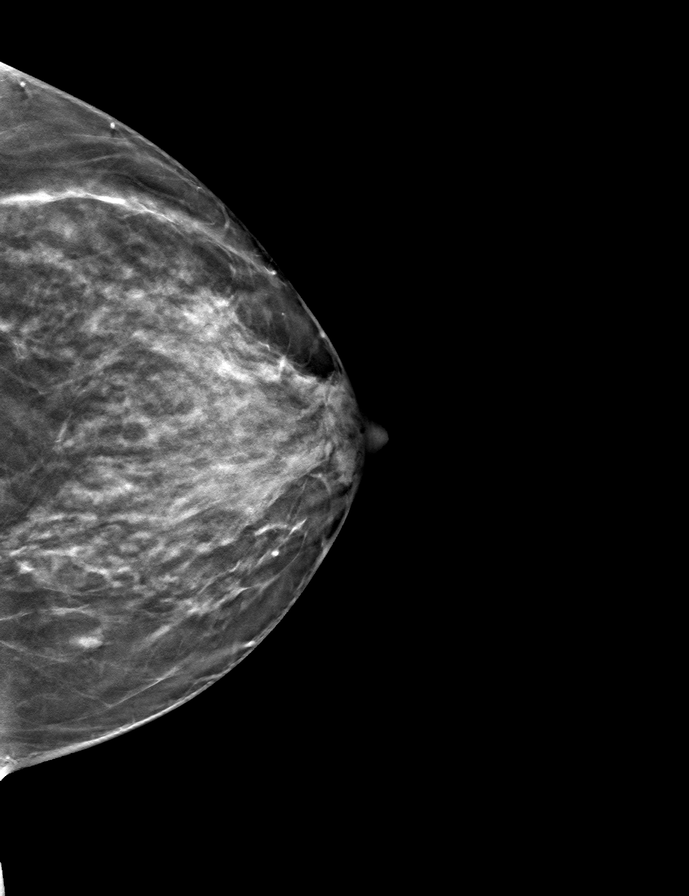

[R MLO tomo · tomo slice 31/61.0]
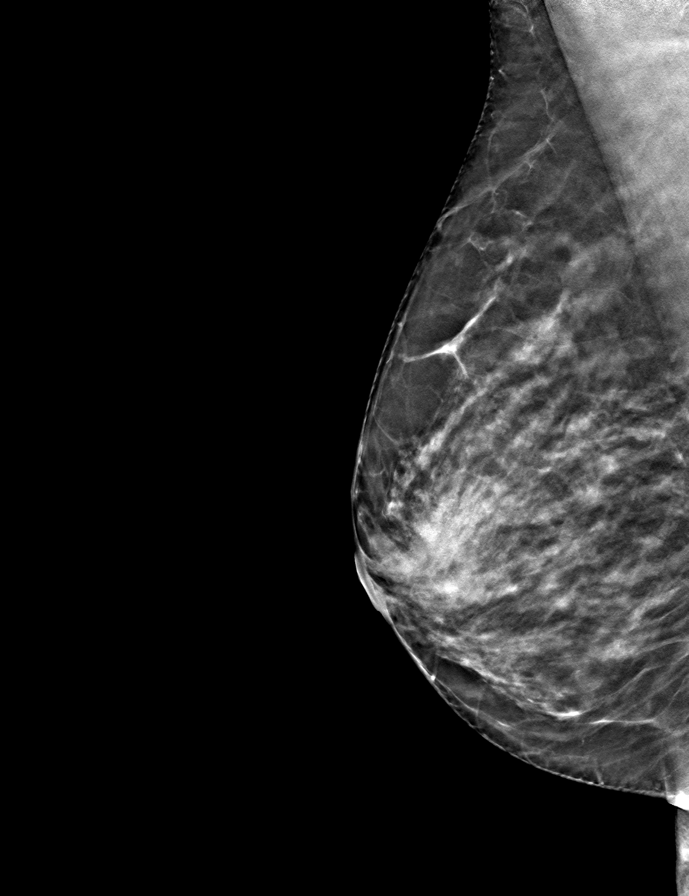

[L MLO tomo · tomo slice 34/67.0]
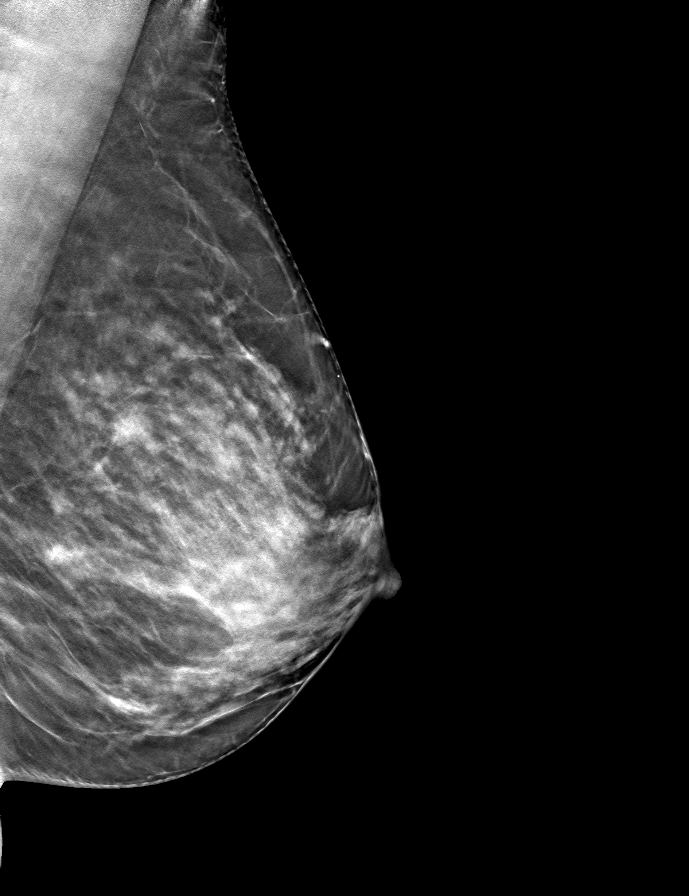

[R CC tomo · tomo slice 32/63.0]
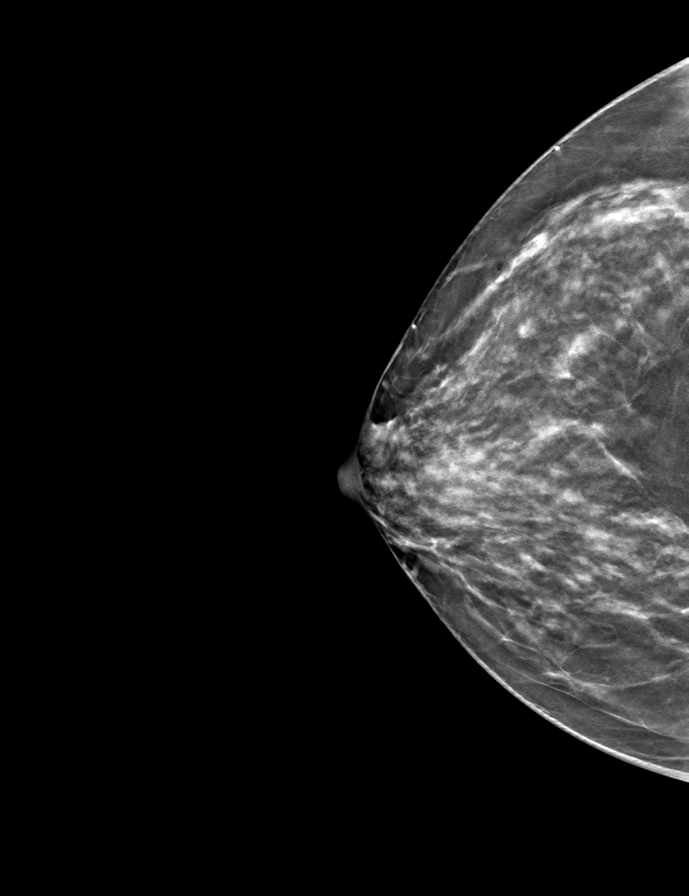

[9 of 24 positions shown; findings below may reference images not displayed]

ACR Breast Density Category c: The breast tissue is heterogeneously
dense, which may obscure small masses.
FINDINGS: There are no findings suspicious for malignancy. Images were
processed with CAD.
IMPRESSION: No mammographic evidence of malignancy. A result letter of this
screening mammogram will be mailed directly to the patient.

RECOMMENDATION:
Screening mammogram in one year. (Code:[id number])

BI-RADS CATEGORY  1: Negative.

## 2019-04-03 DIAGNOSIS — H524 Presbyopia: Secondary | ICD-10-CM | POA: Diagnosis not present

## 2019-04-03 DIAGNOSIS — H5213 Myopia, bilateral: Secondary | ICD-10-CM | POA: Diagnosis not present

## 2019-04-28 ENCOUNTER — Other Ambulatory Visit: Payer: Self-pay | Admitting: Obstetrics and Gynecology

## 2019-04-28 DIAGNOSIS — Z1231 Encounter for screening mammogram for malignant neoplasm of breast: Secondary | ICD-10-CM

## 2019-06-14 ENCOUNTER — Ambulatory Visit
Admission: RE | Admit: 2019-06-14 | Discharge: 2019-06-14 | Disposition: A | Discharge status: Home / Self Care | Payer: 59 | Source: Ambulatory Visit | Attending: Obstetrics and Gynecology | Admitting: Obstetrics and Gynecology

## 2019-06-14 ENCOUNTER — Other Ambulatory Visit: Payer: Self-pay

## 2019-06-14 DIAGNOSIS — Z1231 Encounter for screening mammogram for malignant neoplasm of breast: Secondary | ICD-10-CM | POA: Diagnosis not present

## 2019-07-31 DIAGNOSIS — Z01419 Encounter for gynecological examination (general) (routine) without abnormal findings: Secondary | ICD-10-CM | POA: Diagnosis not present

## 2019-07-31 DIAGNOSIS — Z6822 Body mass index (BMI) 22.0-22.9, adult: Secondary | ICD-10-CM | POA: Diagnosis not present

## 2019-09-15 DIAGNOSIS — M8588 Other specified disorders of bone density and structure, other site: Secondary | ICD-10-CM | POA: Diagnosis not present

## 2019-09-15 DIAGNOSIS — Z13220 Encounter for screening for lipoid disorders: Secondary | ICD-10-CM | POA: Diagnosis not present

## 2019-09-15 DIAGNOSIS — Z13228 Encounter for screening for other metabolic disorders: Secondary | ICD-10-CM | POA: Diagnosis not present

## 2019-09-15 DIAGNOSIS — Z1329 Encounter for screening for other suspected endocrine disorder: Secondary | ICD-10-CM | POA: Diagnosis not present

## 2019-09-15 DIAGNOSIS — N951 Menopausal and female climacteric states: Secondary | ICD-10-CM | POA: Diagnosis not present

## 2019-09-15 DIAGNOSIS — Z13820 Encounter for screening for osteoporosis: Secondary | ICD-10-CM | POA: Diagnosis not present

## 2019-09-28 ENCOUNTER — Encounter: Payer: Self-pay | Admitting: Obstetrics and Gynecology

## 2019-10-03 DIAGNOSIS — N951 Menopausal and female climacteric states: Secondary | ICD-10-CM | POA: Diagnosis not present

## 2019-10-16 ENCOUNTER — Other Ambulatory Visit (HOSPITAL_COMMUNITY): Payer: Self-pay | Admitting: Obstetrics and Gynecology

## 2019-11-06 DIAGNOSIS — R945 Abnormal results of liver function studies: Secondary | ICD-10-CM | POA: Diagnosis not present

## 2019-11-27 DIAGNOSIS — L292 Pruritus vulvae: Secondary | ICD-10-CM | POA: Diagnosis not present

## 2019-11-27 DIAGNOSIS — K649 Unspecified hemorrhoids: Secondary | ICD-10-CM | POA: Diagnosis not present

## 2020-01-22 DIAGNOSIS — N959 Unspecified menopausal and perimenopausal disorder: Secondary | ICD-10-CM | POA: Diagnosis not present

## 2020-02-18 DIAGNOSIS — Z20822 Contact with and (suspected) exposure to covid-19: Secondary | ICD-10-CM | POA: Diagnosis not present

## 2020-02-18 DIAGNOSIS — R509 Fever, unspecified: Secondary | ICD-10-CM | POA: Diagnosis not present

## 2020-04-18 DIAGNOSIS — H524 Presbyopia: Secondary | ICD-10-CM | POA: Diagnosis not present

## 2020-04-18 DIAGNOSIS — H5213 Myopia, bilateral: Secondary | ICD-10-CM | POA: Diagnosis not present

## 2020-05-11 DIAGNOSIS — M8580 Other specified disorders of bone density and structure, unspecified site: Secondary | ICD-10-CM

## 2020-05-11 HISTORY — DX: Other specified disorders of bone density and structure, unspecified site: M85.80

## 2020-05-24 ENCOUNTER — Other Ambulatory Visit (HOSPITAL_COMMUNITY): Payer: Self-pay | Admitting: Obstetrics and Gynecology

## 2020-08-27 ENCOUNTER — Other Ambulatory Visit (HOSPITAL_COMMUNITY): Payer: Self-pay

## 2020-08-27 DIAGNOSIS — Z6823 Body mass index (BMI) 23.0-23.9, adult: Secondary | ICD-10-CM | POA: Diagnosis not present

## 2020-08-27 DIAGNOSIS — N951 Menopausal and female climacteric states: Secondary | ICD-10-CM | POA: Diagnosis not present

## 2020-08-27 DIAGNOSIS — Z01419 Encounter for gynecological examination (general) (routine) without abnormal findings: Secondary | ICD-10-CM | POA: Diagnosis not present

## 2020-08-27 MED ORDER — HYDROCORT-PRAMOXINE (PERIANAL) 2.5-1 % EX CREA
1.0000 "application " | TOPICAL_CREAM | Freq: Every day | CUTANEOUS | 6 refills | Status: DC
  Filled 2020-08-27: qty 30, 30d supply, fill #0

## 2020-08-27 MED ORDER — GABAPENTIN 100 MG PO CAPS
200.0000 mg | ORAL_CAPSULE | Freq: Every day | ORAL | 4 refills | Status: DC
  Filled 2020-08-27: qty 180, 90d supply, fill #0

## 2020-08-29 ENCOUNTER — Other Ambulatory Visit (HOSPITAL_COMMUNITY): Payer: Self-pay

## 2020-08-30 ENCOUNTER — Encounter: Payer: Self-pay | Admitting: Gastroenterology

## 2020-08-30 ENCOUNTER — Other Ambulatory Visit (HOSPITAL_COMMUNITY): Payer: Self-pay

## 2020-09-03 ENCOUNTER — Other Ambulatory Visit (HOSPITAL_COMMUNITY): Payer: Self-pay

## 2020-09-03 DIAGNOSIS — Z1329 Encounter for screening for other suspected endocrine disorder: Secondary | ICD-10-CM | POA: Diagnosis not present

## 2020-09-03 DIAGNOSIS — Z13220 Encounter for screening for lipoid disorders: Secondary | ICD-10-CM | POA: Diagnosis not present

## 2020-09-03 DIAGNOSIS — Z1321 Encounter for screening for nutritional disorder: Secondary | ICD-10-CM | POA: Diagnosis not present

## 2020-09-03 DIAGNOSIS — Z13228 Encounter for screening for other metabolic disorders: Secondary | ICD-10-CM | POA: Diagnosis not present

## 2020-09-03 DIAGNOSIS — Z1231 Encounter for screening mammogram for malignant neoplasm of breast: Secondary | ICD-10-CM | POA: Diagnosis not present

## 2020-09-03 DIAGNOSIS — Z809 Family history of malignant neoplasm, unspecified: Secondary | ICD-10-CM | POA: Diagnosis not present

## 2020-11-08 ENCOUNTER — Ambulatory Visit (AMBULATORY_SURGERY_CENTER): Payer: 59

## 2020-11-08 VITALS — Ht 65.5 in | Wt 140.0 lb

## 2020-11-08 DIAGNOSIS — Z1211 Encounter for screening for malignant neoplasm of colon: Secondary | ICD-10-CM

## 2020-11-08 MED ORDER — NA SULFATE-K SULFATE-MG SULF 17.5-3.13-1.6 GM/177ML PO SOLN
1.0000 | Freq: Once | ORAL | 0 refills | Status: AC
  Filled 2020-11-08: qty 354, 1d supply, fill #0

## 2020-11-08 MED ORDER — ONDANSETRON HCL 4 MG PO TABS
4.0000 mg | ORAL_TABLET | ORAL | 0 refills | Status: DC | PRN
  Filled 2020-11-08: qty 2, 1d supply, fill #0

## 2020-11-08 NOTE — Progress Notes (Signed)
No egg or soy allergy known to patient  No issues with past sedation with any surgeries or procedures   Patient has not been intubated in the past   No FH of Malignant Hyperthermia  No diet pills per patient No home 02 use per patient  No blood thinners per patient  Pt denies issues with constipation  No A fib or A flutter  EMMI video to pt or via [place] 19 guidelines implemented in PV today with Pt and RN     NO PA's for preps discussed with pt In PV today  Discussed with pt there will be an out-of-pocket cost for prep and that varies from $0 to 70 dollars   Due to the COVID-19 pandemic we are asking patients to follow certain guidelines.  Pt aware of COVID protocols and [facility] guidelines

## 2020-11-12 ENCOUNTER — Other Ambulatory Visit (HOSPITAL_COMMUNITY): Payer: Self-pay

## 2020-11-14 ENCOUNTER — Other Ambulatory Visit (HOSPITAL_COMMUNITY): Payer: Self-pay

## 2020-11-14 MED ORDER — NYSTATIN-TRIAMCINOLONE 100000-0.1 UNIT/GM-% EX OINT
TOPICAL_OINTMENT | CUTANEOUS | 0 refills | Status: DC
  Filled 2020-11-14: qty 30, 15d supply, fill #0

## 2020-11-22 ENCOUNTER — Encounter: Payer: Self-pay | Admitting: Gastroenterology

## 2020-11-22 ENCOUNTER — Other Ambulatory Visit (HOSPITAL_COMMUNITY): Payer: Self-pay

## 2020-11-22 ENCOUNTER — Ambulatory Visit (AMBULATORY_SURGERY_CENTER): Payer: 59 | Admitting: Gastroenterology

## 2020-11-22 ENCOUNTER — Other Ambulatory Visit: Payer: Self-pay

## 2020-11-22 VITALS — BP 112/61 | HR 55 | Temp 97.5°F | Resp 11 | Ht 65.5 in | Wt 140.0 lb

## 2020-11-22 DIAGNOSIS — D128 Benign neoplasm of rectum: Secondary | ICD-10-CM

## 2020-11-22 DIAGNOSIS — Z1211 Encounter for screening for malignant neoplasm of colon: Secondary | ICD-10-CM | POA: Diagnosis not present

## 2020-11-22 MED ORDER — SODIUM CHLORIDE 0.9 % IV SOLN: 500.0000 mL | Freq: Once | INTRAVENOUS | Status: DC

## 2020-11-22 MED ORDER — CLOTRIMAZOLE 1 % EX CREA: TOPICAL_CREAM | Freq: Two times a day (BID) | CUTANEOUS | Status: AC

## 2020-11-22 NOTE — Op Note (Addendum)
[place] Patient Name: Linda Vance Procedure Date: 11/22/2020 11:14 AM MRN: [id number] Endoscopist: Ladene Artist , MD Age: 50 Referring MD:  Date of Birth: 07/10/1970 Gender: Female Account #: [id number] Procedure:                Colonoscopy Indications:              Screening for colorectal malignant neoplasm Medicines:                Monitored Anesthesia Care Procedure:                Pre-Anesthesia Assessment:                           - Prior to the procedure, a History and Physical                            was performed, and patient medications and                            allergies were reviewed. The patient's tolerance of                            previous anesthesia was also reviewed. The risks                            and benefits of the procedure and the sedation                            options and risks were discussed with the patient.                            All questions were answered, and informed consent                            was obtained. Prior Anticoagulants: The patient has                            taken no previous anticoagulant or antiplatelet                            agents. ASA Grade Assessment: II - A patient with                            mild systemic disease. After reviewing the risks                            and benefits, the patient was deemed in                            satisfactory condition to undergo the procedure.                           After obtaining informed consent, the colonoscope  was passed under direct vision. Throughout the                            procedure, the patient's blood pressure, pulse, and                            oxygen saturations were monitored continuously. The                            Olympus CF-HQ190L [id number]) Colonoscope was                            introduced through the anus and advanced to the the                            cecum,  identified by appendiceal orifice and                            ileocecal valve. The ileocecal valve, appendiceal                            orifice, and rectum were photographed. The quality                            of the bowel preparation was good. The colonoscopy                            was performed without difficulty. The patient                            tolerated the procedure well. Scope In: 11:25:30 AM Scope Out: 11:49:15 AM Scope Withdrawal Time: 0 hours 18 minutes 25 seconds  Total Procedure Duration: 0 hours 23 minutes 45 seconds  Findings:                 The perianal and digital rectal examinations showed                            small external hemorrhoids, external skin tags and                            subtle perianal erythema.                           A 16 mm polyp was found in the mid rectum. The                            polyp was sessile. The polyp was removed with a hot                            snare. Resection and retrieval were complete.                           Internal hemorrhoids were found during  retroflexion. The hemorrhoids were small and Grade                            I (internal hemorrhoids that do not prolapse).                           The exam was otherwise without abnormality on                            direct and retroflexion views. Complications:            No immediate complications. Estimated blood loss:                            None. Estimated Blood Loss:     Estimated blood loss: none. Impression:               - One 16 mm polyp in the mid rectum, removed with a                            hot snare. Resected and retrieved.                           - Small internal hemorrhoids.                           - Small external hemorrhoids, external skin tags                            and subtle perianal erythema.                           - The examination was otherwise normal on direct                             and retroflexion views. Recommendation:           - Repeat colonoscopy after studies are complete for                            surveillance based on pathology results.                           - Patient has a contact number available for                            emergencies. The signs and symptoms of potential                            delayed complications were discussed with the                            patient. Return to normal activities tomorrow.                            Written discharge instructions were provided to the  patient.                           - Resume previous diet.                           - Continue present medications.                           - Lotrimin AF topically to perianal area tid for 1                            week.                           - Await pathology results.                           - No aspirin, ibuprofen, naproxen, or other                            non-steroidal anti-inflammatory drugs for 2 weeks                            after polyp removal. Ladene Artist, MD 11/22/2020 11:54:43 AM This report has been signed electronically.

## 2020-11-22 NOTE — Patient Instructions (Signed)
Please, use the new cream as directed.  Read all of the handouts given to you by your recovery room nurse.   No NSAIDS x 2 weeks: Aspirin, Aleve or ibuprofen.  YOU HAD AN ENDOSCOPIC PROCEDURE TODAY AT [facility]:   Refer to the procedure report that was given to you for any specific questions about what was found during the examination.  If the procedure report does not answer your questions, please call your gastroenterologist to clarify.  If you requested that your care partner not be given the details of your procedure findings, then the procedure report has been included in a sealed envelope for you to review at your convenience later.  YOU SHOULD EXPECT: Some feelings of bloating in the abdomen. Passage of more gas than usual.  Walking can help get rid of the air that was put into your GI tract during the procedure and reduce the bloating. If you had a lower endoscopy (such as a colonoscopy or flexible sigmoidoscopy) you may notice spotting of blood in your stool or on the toilet paper. If you underwent a bowel prep for your procedure, you may not have a normal bowel movement for a few days.  Please Note:  You might notice some irritation and congestion in your nose or some drainage.  This is from the oxygen used during your procedure.  There is no need for concern and it should clear up in a day or so.  SYMPTOMS TO REPORT IMMEDIATELY:  Following lower endoscopy (colonoscopy or flexible sigmoidoscopy):  Excessive amounts of blood in the stool  Significant tenderness or worsening of abdominal pains  Swelling of the abdomen that is new, acute  Fever of 100F or higher   For urgent or emergent issues, a gastroenterologist can be reached at any hour by calling [phone]. Do not use MyChart messaging for urgent concerns.    DIET:  We do recommend a small meal at first, but then you may proceed to your regular diet.  Drink plenty of fluids but you should avoid alcoholic  beverages for 24 hours.  ACTIVITY:  You should plan to take it easy for the rest of today and you should NOT DRIVE or use heavy machinery until tomorrow (because of the sedation medicines used during the test).    FOLLOW UP: Our staff will call the number listed on your records 48-72 hours following your procedure to check on you and address any questions or concerns that you may have regarding the information given to you following your procedure. If we do not reach you, we will leave a message.  We will attempt to reach you two times.  During this call, we will ask if you have developed any symptoms of COVID 19. If you develop any symptoms (ie: fever, flu-like symptoms, shortness of breath, cough etc.) before then, please call [phone].  If you test positive for Covid 19 in the 2 weeks post procedure, please call and report this information to [place].    If any biopsies were taken you will be contacted by phone or by letter within the next 1-3 weeks.  Please call us at [phone] if you have not heard about the biopsies in 3 weeks.    SIGNATURES/CONFIDENTIALITY: You and/or your care partner have signed paperwork which will be entered into your electronic medical record.  These signatures attest to the fact that that the information above on your After Visit Summary has been reviewed and is understood.  Full  responsibility of the confidentiality of this discharge information lies with you and/or your care-partner.  

## 2020-11-22 NOTE — Progress Notes (Signed)
PT taken to PACU. Monitors in place. VSS. Report given to RN. 

## 2020-11-22 NOTE — Progress Notes (Signed)
Pt's states no medical or surgical changes since previsit or office visit.   VS taken by CW 

## 2020-11-25 ENCOUNTER — Other Ambulatory Visit (HOSPITAL_COMMUNITY): Payer: Self-pay

## 2020-11-25 MED ORDER — GABAPENTIN 100 MG PO CAPS
200.0000 mg | ORAL_CAPSULE | Freq: Every day | ORAL | 6 refills | Status: DC
  Filled 2020-11-25: qty 60, 30d supply, fill #0

## 2020-11-25 MED ORDER — GABAPENTIN 100 MG PO CAPS
200.0000 mg | ORAL_CAPSULE | Freq: Every day | ORAL | 6 refills | Status: DC
  Filled 2020-11-25: qty 180, 90d supply, fill #0
  Filled 2021-02-24: qty 180, 90d supply, fill #1
  Filled 2021-05-26: qty 180, 90d supply, fill #2
  Filled 2021-08-24: qty 180, 90d supply, fill #3
  Filled 2021-11-21: qty 180, 90d supply, fill #4

## 2020-11-25 MED FILL — Gabapentin Cap 100 MG: ORAL | 30 days supply | Qty: 60 | Fill #0 | Status: CN

## 2020-11-26 ENCOUNTER — Other Ambulatory Visit (HOSPITAL_COMMUNITY): Payer: Self-pay

## 2020-11-26 ENCOUNTER — Telehealth: Payer: Self-pay | Admitting: *Deleted

## 2020-11-26 ENCOUNTER — Telehealth: Payer: Self-pay

## 2020-11-26 NOTE — Telephone Encounter (Signed)
Attempted f/u phone call. No answer. Left message. °

## 2020-11-26 NOTE — Telephone Encounter (Signed)
  Follow up Call-  Call back number 11/22/2020  Post procedure Call Back phone  # [phone]  Permission to leave phone message Yes  Some recent data might be hidden     Patient questions:  Do you have a fever, pain , or abdominal swelling? No. Pain Score  0 *  Have you tolerated food without any problems? Yes.    Have you been able to return to your normal activities? Yes.    Do you have any questions about your discharge instructions: Diet   No. Medications  No. Follow up visit  No.  Do you have questions or concerns about your Care? No.  Actions: * If pain score is 4 or above: No action needed, pain <4.

## 2020-12-04 ENCOUNTER — Encounter: Payer: Self-pay | Admitting: Gastroenterology

## 2021-01-09 ENCOUNTER — Other Ambulatory Visit (HOSPITAL_COMMUNITY): Payer: Self-pay

## 2021-01-09 DIAGNOSIS — L309 Dermatitis, unspecified: Secondary | ICD-10-CM | POA: Diagnosis not present

## 2021-01-09 DIAGNOSIS — Z85828 Personal history of other malignant neoplasm of skin: Secondary | ICD-10-CM | POA: Diagnosis not present

## 2021-01-09 MED ORDER — TACROLIMUS 0.1 % EX OINT
TOPICAL_OINTMENT | CUTANEOUS | 4 refills | Status: AC
  Filled 2021-01-09 (×2): qty 30, 30d supply, fill #0

## 2021-01-27 ENCOUNTER — Other Ambulatory Visit (HOSPITAL_COMMUNITY): Payer: Self-pay

## 2021-01-27 DIAGNOSIS — M79671 Pain in right foot: Secondary | ICD-10-CM | POA: Diagnosis not present

## 2021-01-27 DIAGNOSIS — M109 Gout, unspecified: Secondary | ICD-10-CM | POA: Diagnosis not present

## 2021-01-27 DIAGNOSIS — R7989 Other specified abnormal findings of blood chemistry: Secondary | ICD-10-CM | POA: Diagnosis not present

## 2021-01-27 DIAGNOSIS — M8580 Other specified disorders of bone density and structure, unspecified site: Secondary | ICD-10-CM | POA: Diagnosis not present

## 2021-01-27 DIAGNOSIS — Z86010 Personal history of colonic polyps: Secondary | ICD-10-CM | POA: Diagnosis not present

## 2021-01-27 MED ORDER — COLCHICINE 0.6 MG PO TABS
0.6000 mg | ORAL_TABLET | Freq: Three times a day (TID) | ORAL | 1 refills | Status: AC | PRN
  Filled 2021-01-27: qty 30, 10d supply, fill #0

## 2021-02-25 ENCOUNTER — Other Ambulatory Visit (HOSPITAL_COMMUNITY): Payer: Self-pay

## 2021-03-12 ENCOUNTER — Ambulatory Visit (INDEPENDENT_AMBULATORY_CARE_PROVIDER_SITE_OTHER): Payer: 59 | Admitting: Nurse Practitioner

## 2021-03-12 ENCOUNTER — Encounter: Payer: Self-pay | Admitting: Nurse Practitioner

## 2021-03-12 VITALS — BP 122/78 | HR 64 | Ht 65.5 in | Wt 141.5 lb

## 2021-03-12 DIAGNOSIS — K644 Residual hemorrhoidal skin tags: Secondary | ICD-10-CM

## 2021-03-12 NOTE — Progress Notes (Signed)
ASSESSMENT AND PLAN    # History of external hemorrhoids. Presently no inflammation, mainly just small amount of perianal fleshy tissue from previous hemorrhoid  --Start daily Benefiber --Start one stool softener at bedtime --If recurrent perianal itching, rectal discomfort or feels like hemorrhoid is swelling then recommend 10 day course of Prep H at bedtime If no improvement, or quick recurrence of symptoms then call us for prescription strength of steroid cream. Advised against using Prep H as a maintenance cream.  #History of adenomatous colon polyps.  Due for 3-year polyp surveillance colonoscopy in July 2025  HISTORY OF PRESENT ILLNESS    Chief Complaint : Possible hemorrhoid  Linda Vance is a 50 y.o. female known to Dr. Fuller Plan with a past medical history significant for internal hemorrhoids, cholecystitis status postcholecystectomy in 2018, appendectomy. See PMH below for any additional medical problems.    Patient is known to Dr. Fuller Plan.  She was last seen at time of screening colonoscopy in July 2022 at which time a 49mm sessile polyp was removed from the rectum and and internal hemorrhoids were found.  Polyp was a tubular adenoma.  Advised to have a 3 year polyp surveillance colonoscopy July 2025  Linda Vance is here with perianal discomfort. She was having problems with a perianal rash with itching . Her GYN gave her an ointment which helped ( ? Steroid). She discussed with Dr. Fuller Plan at time of July 2022 of colonoscopy and he apparently recommended Lotrimen cream and if rash didn't improve then see a Dermatologist. Saw Dermatology and was prescribed tacrolimus 0.1% ointment which has helped. Now she thinks that she has a hemorrhoid .  Using Prep H sporadically with improvement. She occasionally strains to have a BM but stools not generally hard. No pain or bleeding with defecation.    PREVIOUS ENDOSCOPIC EVALUATIONS / PERTINENT STUDIES:    Screening colonoscopy July  2022 Complete exam, good prep 16 mm sessile rectal polyp removed Path - tubular adenoma Repeat colonoscopy in 3 years.     Past Medical History:  Diagnosis Date   Biliary colic    Cholelithiasis    Osteopenia 2022   Wears contact lenses     Current Medications, Allergies, Past Surgical History, Family History and Social History were reviewed in [employer] record.   Current Outpatient Medications  Medication Sig Dispense Refill   Ascorbic Acid (VITAMIN C) 1000 MG tablet Take 1,000 mg by mouth daily.     calcium-vitamin D (OSCAL WITH D) 250-125 MG-UNIT tablet Take 2 tablets by mouth 2 (two) times daily.     co-enzyme Q-10 30 MG capsule Take 30 mg by mouth 3 (three) times daily.     colchicine 0.6 MG tablet Take 1 tablet (0.6 mg total) by mouth 3 (three) times daily as needed for gout flare. 30 tablet 1   ferrous sulfate 325 (65 FE) MG tablet Take 325 mg by mouth daily with breakfast.     gabapentin (NEURONTIN) 100 MG capsule TAKE 2 CAPSULES BY MOUTH DAILY AT BEDTIME 60 capsule 6   glucosamine-chondroitin 500-400 MG tablet Take 1 tablet by mouth daily.     ibuprofen (ADVIL,MOTRIN) 800 MG tablet Take 1 tablet (800 mg total) by mouth every 8 (eight) hours as needed. 30 tablet 0   Potassium Gluconate 550 (90 K) MG TABS Take 1 capsule by mouth daily.     tacrolimus (PROTOPIC) 0.1 % ointment Apply 1 application topically on the skin as directed as needed. [place]  g 4   vitamin B-12 (CYANOCOBALAMIN) 1000 MCG tablet Take 1,000 mcg by mouth daily. 2500 mg     vitamin E 400 UNIT capsule Take 400 Units by mouth daily.     Current Facility-Administered Medications  Medication Dose Route Frequency Provider Last Rate Last Admin   clotrimazole (LOTRIMIN) 1 % cream   Topical BID Ladene Artist, MD        Review of Systems: No chest pain. No shortness of breath. No urinary complaints.   PHYSICAL EXAM :    Wt Readings from Last 3 Encounters:  03/12/21 141 lb 8 oz (64.2 kg)   11/22/20 140 lb (63.5 kg)  11/08/20 140 lb (63.5 kg)    BP 122/78   Pulse 64   Ht 5' 5.5" (1.664 m)   Wt 141 lb 8 oz (64.2 kg)   LMP 12/03/2017   BMI 23.19 kg/m  Constitutional:  Generally well appearing female in no acute distress. Psychiatric: Pleasant. Normal mood and affect. Behavior is normal. Cardiovascular: Normal rate, regular rhythm. No edema Pulmonary/chest: Effort normal and breath sounds normal. No wheezing, rales or rhonchi. Abdominal: Soft, nondistended, nontender. Bowel sounds active throughout. There are no masses palpable. No hepatomegaly. Rectal: Small anterior midline non-inflamed hemorrhoidal tissue Neurological: Alert and oriented to person place and time.   Tye Savoy, NP  03/12/2021, 11:19 AM

## 2021-03-12 NOTE — Patient Instructions (Addendum)
If you are age 52 or younger, your body mass index should be between 19-25. Your Body mass index is 23.19 kg/m. If this is out of the aformentioned range listed, please consider follow up with your Primary Care Provider.   The [facility] providers would like to encourage you to use [facility] to communicate with providers for non-urgent requests or questions.  Due to long hold times on the telephone, sending your provider a message by [facility] may be faster and more efficient way to get a response. Please allow 48 business hours for a response.  Please remember that this is for non-urgent requests/questions.  --Start daily Benefiber --Start one stool softener at bedtime --If recurrent perianal itching, rectal discomfort or feel like hemorrhoids swelling then recommend 10 day course of Prep H at bedtime If no improvement or quick recurrence of symptoms then call us for prescription strength dose of steroid cream. Do not use Prep H as a maintenance cream.   It was great seeing you today! Thank you for entrusting me with your care and choosing [facility].  Tye Savoy, NP

## 2021-03-13 NOTE — Progress Notes (Signed)
Reviewed and agree with management plan.  Jonne Rote T. Abdurrahman Petersheim, MD FACG 

## 2021-04-21 DIAGNOSIS — H5213 Myopia, bilateral: Secondary | ICD-10-CM | POA: Diagnosis not present

## 2021-04-21 DIAGNOSIS — H524 Presbyopia: Secondary | ICD-10-CM | POA: Diagnosis not present

## 2021-05-15 DIAGNOSIS — L821 Other seborrheic keratosis: Secondary | ICD-10-CM | POA: Diagnosis not present

## 2021-05-15 DIAGNOSIS — D225 Melanocytic nevi of trunk: Secondary | ICD-10-CM | POA: Diagnosis not present

## 2021-05-15 DIAGNOSIS — Z85828 Personal history of other malignant neoplasm of skin: Secondary | ICD-10-CM | POA: Diagnosis not present

## 2021-05-15 DIAGNOSIS — L820 Inflamed seborrheic keratosis: Secondary | ICD-10-CM | POA: Diagnosis not present

## 2021-05-15 DIAGNOSIS — D2271 Melanocytic nevi of right lower limb, including hip: Secondary | ICD-10-CM | POA: Diagnosis not present

## 2021-05-15 DIAGNOSIS — D485 Neoplasm of uncertain behavior of skin: Secondary | ICD-10-CM | POA: Diagnosis not present

## 2021-05-15 DIAGNOSIS — D1801 Hemangioma of skin and subcutaneous tissue: Secondary | ICD-10-CM | POA: Diagnosis not present

## 2021-05-26 ENCOUNTER — Other Ambulatory Visit (HOSPITAL_COMMUNITY): Payer: Self-pay

## 2021-08-25 ENCOUNTER — Other Ambulatory Visit (HOSPITAL_COMMUNITY): Payer: Self-pay

## 2021-08-27 ENCOUNTER — Telehealth: Payer: Self-pay

## 2021-08-27 NOTE — Telephone Encounter (Signed)
Patient continues to have rash and itching.  She would like to have prescription strength dose of steroid cream sent to pharmacy.  ?

## 2021-08-27 NOTE — Telephone Encounter (Signed)
Resume Prep H topically for 7-10 days. Continue fiber supplement. The cause of her perianal skin rash is not clear to me. I defer to her dermatologist for mgmt. recommendations if it does not completely resolve after this course of Prep H.   ?

## 2021-08-27 NOTE — Telephone Encounter (Signed)
Spoke with pt and pt reports she has continued to have redness and irritation around the rectum area. Pt states she has no signs of hemorrhoids just a rash. Bowel movements are regular and pt is taking fiber and states she is doing all recommendations from last office appt with Nevin Bloodgood. Pt states rash improved with Prep H but that she was only advised to use this for 10 days and once 10 days was up rash came back. Pt also states that rash improved with cream that obgyn gave her and with the tacrolimus that the dermatologist gave her. But rash always returns no matter what she puts on it.  Currently pt is using the tacrolimus on the area but is almost out of this. She states she wants to find the cause of the rash because she doesn't want to have to put cream on her rectum everyday and the continuous itching and irritation has really been problematic for her.  ?

## 2021-08-28 NOTE — Telephone Encounter (Signed)
Spoke with pt and gave pt recommendations. Pt verbalized understanding and had no other concerns at end of call.  

## 2021-10-27 ENCOUNTER — Other Ambulatory Visit (HOSPITAL_COMMUNITY): Payer: Self-pay

## 2021-10-27 MED ORDER — VALACYCLOVIR HCL 1 G PO TABS
ORAL_TABLET | ORAL | 0 refills | Status: DC
  Filled 2021-10-27: qty 4, 1d supply, fill #0

## 2021-11-17 ENCOUNTER — Other Ambulatory Visit (HOSPITAL_COMMUNITY): Payer: Self-pay

## 2021-11-17 DIAGNOSIS — Z124 Encounter for screening for malignant neoplasm of cervix: Secondary | ICD-10-CM | POA: Diagnosis not present

## 2021-11-17 DIAGNOSIS — Z1231 Encounter for screening mammogram for malignant neoplasm of breast: Secondary | ICD-10-CM | POA: Diagnosis not present

## 2021-11-17 DIAGNOSIS — Z6822 Body mass index (BMI) 22.0-22.9, adult: Secondary | ICD-10-CM | POA: Diagnosis not present

## 2021-11-17 DIAGNOSIS — Z1151 Encounter for screening for human papillomavirus (HPV): Secondary | ICD-10-CM | POA: Diagnosis not present

## 2021-11-17 DIAGNOSIS — Z01419 Encounter for gynecological examination (general) (routine) without abnormal findings: Secondary | ICD-10-CM | POA: Diagnosis not present

## 2021-11-17 MED ORDER — CLOTRIMAZOLE-BETAMETHASONE 1-0.05 % EX CREA
1.0000 | TOPICAL_CREAM | Freq: Two times a day (BID) | CUTANEOUS | 6 refills | Status: AC
  Filled 2021-11-17: qty 15, 14d supply, fill #0
  Filled 2021-11-19: qty 15, 8d supply, fill #0

## 2021-11-19 ENCOUNTER — Other Ambulatory Visit (HOSPITAL_COMMUNITY): Payer: Self-pay

## 2021-11-19 DIAGNOSIS — Z13220 Encounter for screening for lipoid disorders: Secondary | ICD-10-CM | POA: Diagnosis not present

## 2021-11-19 DIAGNOSIS — Z1329 Encounter for screening for other suspected endocrine disorder: Secondary | ICD-10-CM | POA: Diagnosis not present

## 2021-11-19 DIAGNOSIS — Z13228 Encounter for screening for other metabolic disorders: Secondary | ICD-10-CM | POA: Diagnosis not present

## 2021-11-19 DIAGNOSIS — Z1321 Encounter for screening for nutritional disorder: Secondary | ICD-10-CM | POA: Diagnosis not present

## 2021-11-19 DIAGNOSIS — Z131 Encounter for screening for diabetes mellitus: Secondary | ICD-10-CM | POA: Diagnosis not present

## 2021-11-21 ENCOUNTER — Other Ambulatory Visit (HOSPITAL_COMMUNITY): Payer: Self-pay

## 2021-11-25 ENCOUNTER — Other Ambulatory Visit (HOSPITAL_COMMUNITY): Payer: Self-pay

## 2021-11-25 DIAGNOSIS — Z85828 Personal history of other malignant neoplasm of skin: Secondary | ICD-10-CM | POA: Diagnosis not present

## 2021-11-25 DIAGNOSIS — D2371 Other benign neoplasm of skin of right lower limb, including hip: Secondary | ICD-10-CM | POA: Diagnosis not present

## 2021-11-25 DIAGNOSIS — D485 Neoplasm of uncertain behavior of skin: Secondary | ICD-10-CM | POA: Diagnosis not present

## 2021-11-25 MED ORDER — VALACYCLOVIR HCL 1 G PO TABS
2000.0000 mg | ORAL_TABLET | Freq: Two times a day (BID) | ORAL | 1 refills | Status: AC
  Filled 2021-11-25: qty 4, 1d supply, fill #0

## 2021-12-24 ENCOUNTER — Other Ambulatory Visit (HOSPITAL_COMMUNITY): Payer: Self-pay

## 2021-12-24 MED ORDER — KETOCONAZOLE 2 % EX CREA
1.0000 | TOPICAL_CREAM | Freq: Every day | CUTANEOUS | 0 refills | Status: AC
  Filled 2021-12-24: qty 15, 10d supply, fill #0

## 2021-12-24 MED ORDER — CLOBETASOL PROPIONATE 0.05 % EX CREA
1.0000 | TOPICAL_CREAM | Freq: Two times a day (BID) | CUTANEOUS | 6 refills | Status: AC
  Filled 2021-12-24: qty 15, 7d supply, fill #0

## 2022-01-29 DIAGNOSIS — L293 Anogenital pruritus, unspecified: Secondary | ICD-10-CM | POA: Diagnosis not present

## 2022-01-29 DIAGNOSIS — L309 Dermatitis, unspecified: Secondary | ICD-10-CM | POA: Diagnosis not present

## 2022-02-03 DIAGNOSIS — M21611 Bunion of right foot: Secondary | ICD-10-CM | POA: Diagnosis not present

## 2022-02-03 DIAGNOSIS — Z0000 Encounter for general adult medical examination without abnormal findings: Secondary | ICD-10-CM | POA: Diagnosis not present

## 2022-02-03 DIAGNOSIS — Z1339 Encounter for screening examination for other mental health and behavioral disorders: Secondary | ICD-10-CM | POA: Diagnosis not present

## 2022-02-03 DIAGNOSIS — Z1331 Encounter for screening for depression: Secondary | ICD-10-CM | POA: Diagnosis not present

## 2022-02-03 DIAGNOSIS — M109 Gout, unspecified: Secondary | ICD-10-CM | POA: Diagnosis not present

## 2022-02-03 DIAGNOSIS — M8580 Other specified disorders of bone density and structure, unspecified site: Secondary | ICD-10-CM | POA: Diagnosis not present

## 2022-02-03 DIAGNOSIS — Z86010 Personal history of colonic polyps: Secondary | ICD-10-CM | POA: Diagnosis not present

## 2022-02-05 ENCOUNTER — Other Ambulatory Visit (HOSPITAL_COMMUNITY): Payer: Self-pay

## 2022-02-05 MED ORDER — PIMECROLIMUS 1 % EX CREA
TOPICAL_CREAM | Freq: Two times a day (BID) | CUTANEOUS | 3 refills | Status: AC
  Filled 2022-02-05: qty 30, 30d supply, fill #0
  Filled 2022-05-14: qty 30, 30d supply, fill #1

## 2022-02-16 ENCOUNTER — Other Ambulatory Visit (HOSPITAL_COMMUNITY): Payer: Self-pay

## 2022-02-16 MED ORDER — GABAPENTIN 100 MG PO CAPS
200.0000 mg | ORAL_CAPSULE | Freq: Every day | ORAL | 6 refills | Status: DC
  Filled 2022-02-16: qty 180, 90d supply, fill #0
  Filled 2022-05-19: qty 180, 90d supply, fill #1
  Filled 2022-08-27 – 2022-11-18 (×3): qty 180, 90d supply, fill #2
  Filled 2023-02-12: qty 180, 90d supply, fill #3

## 2022-03-27 ENCOUNTER — Other Ambulatory Visit (HOSPITAL_COMMUNITY): Payer: Self-pay

## 2022-04-27 DIAGNOSIS — H524 Presbyopia: Secondary | ICD-10-CM | POA: Diagnosis not present

## 2022-04-27 DIAGNOSIS — H5213 Myopia, bilateral: Secondary | ICD-10-CM | POA: Diagnosis not present

## 2022-06-16 DIAGNOSIS — D225 Melanocytic nevi of trunk: Secondary | ICD-10-CM | POA: Diagnosis not present

## 2022-06-16 DIAGNOSIS — Z85828 Personal history of other malignant neoplasm of skin: Secondary | ICD-10-CM | POA: Diagnosis not present

## 2022-06-16 DIAGNOSIS — D1801 Hemangioma of skin and subcutaneous tissue: Secondary | ICD-10-CM | POA: Diagnosis not present

## 2022-06-16 DIAGNOSIS — L821 Other seborrheic keratosis: Secondary | ICD-10-CM | POA: Diagnosis not present

## 2022-06-16 DIAGNOSIS — L308 Other specified dermatitis: Secondary | ICD-10-CM | POA: Diagnosis not present

## 2022-06-17 DIAGNOSIS — M25512 Pain in left shoulder: Secondary | ICD-10-CM | POA: Diagnosis not present

## 2022-08-27 ENCOUNTER — Other Ambulatory Visit (HOSPITAL_COMMUNITY): Payer: Self-pay

## 2022-08-28 ENCOUNTER — Other Ambulatory Visit (HOSPITAL_COMMUNITY): Payer: Self-pay

## 2022-09-10 ENCOUNTER — Other Ambulatory Visit (HOSPITAL_COMMUNITY): Payer: Self-pay

## 2022-11-18 ENCOUNTER — Other Ambulatory Visit: Payer: Self-pay | Admitting: Oncology

## 2022-11-18 DIAGNOSIS — Z006 Encounter for examination for normal comparison and control in clinical research program: Secondary | ICD-10-CM

## 2022-12-15 DIAGNOSIS — Z131 Encounter for screening for diabetes mellitus: Secondary | ICD-10-CM | POA: Diagnosis not present

## 2022-12-15 DIAGNOSIS — Z1321 Encounter for screening for nutritional disorder: Secondary | ICD-10-CM | POA: Diagnosis not present

## 2022-12-15 DIAGNOSIS — Z13220 Encounter for screening for lipoid disorders: Secondary | ICD-10-CM | POA: Diagnosis not present

## 2022-12-15 DIAGNOSIS — Z1329 Encounter for screening for other suspected endocrine disorder: Secondary | ICD-10-CM | POA: Diagnosis not present

## 2022-12-15 DIAGNOSIS — Z13228 Encounter for screening for other metabolic disorders: Secondary | ICD-10-CM | POA: Diagnosis not present

## 2022-12-22 DIAGNOSIS — Z1231 Encounter for screening mammogram for malignant neoplasm of breast: Secondary | ICD-10-CM | POA: Diagnosis not present

## 2023-01-05 DIAGNOSIS — R8290 Unspecified abnormal findings in urine: Secondary | ICD-10-CM | POA: Diagnosis not present

## 2023-01-05 DIAGNOSIS — Z6824 Body mass index (BMI) 24.0-24.9, adult: Secondary | ICD-10-CM | POA: Diagnosis not present

## 2023-01-05 DIAGNOSIS — Z01419 Encounter for gynecological examination (general) (routine) without abnormal findings: Secondary | ICD-10-CM | POA: Diagnosis not present

## 2023-02-22 ENCOUNTER — Other Ambulatory Visit (HOSPITAL_COMMUNITY): Payer: Self-pay

## 2023-03-23 DIAGNOSIS — Z13820 Encounter for screening for osteoporosis: Secondary | ICD-10-CM | POA: Diagnosis not present

## 2023-05-23 ENCOUNTER — Other Ambulatory Visit (HOSPITAL_COMMUNITY): Payer: Self-pay

## 2023-05-24 ENCOUNTER — Other Ambulatory Visit (HOSPITAL_COMMUNITY): Payer: Self-pay

## 2023-05-24 MED ORDER — GABAPENTIN 100 MG PO CAPS
200.0000 mg | ORAL_CAPSULE | Freq: Every day | ORAL | 1 refills | Status: AC
  Filled 2023-05-24: qty 180, 90d supply, fill #0
  Filled 2023-09-01: qty 180, 90d supply, fill #1

## 2023-07-06 DIAGNOSIS — H52202 Unspecified astigmatism, left eye: Secondary | ICD-10-CM | POA: Diagnosis not present

## 2023-07-06 DIAGNOSIS — H43393 Other vitreous opacities, bilateral: Secondary | ICD-10-CM | POA: Diagnosis not present

## 2023-07-06 DIAGNOSIS — H04123 Dry eye syndrome of bilateral lacrimal glands: Secondary | ICD-10-CM | POA: Diagnosis not present

## 2023-07-06 DIAGNOSIS — H524 Presbyopia: Secondary | ICD-10-CM | POA: Diagnosis not present

## 2023-07-06 DIAGNOSIS — H5213 Myopia, bilateral: Secondary | ICD-10-CM | POA: Diagnosis not present

## 2023-07-15 DIAGNOSIS — Z1339 Encounter for screening examination for other mental health and behavioral disorders: Secondary | ICD-10-CM | POA: Diagnosis not present

## 2023-07-15 DIAGNOSIS — M21611 Bunion of right foot: Secondary | ICD-10-CM | POA: Diagnosis not present

## 2023-07-15 DIAGNOSIS — M8580 Other specified disorders of bone density and structure, unspecified site: Secondary | ICD-10-CM | POA: Diagnosis not present

## 2023-07-15 DIAGNOSIS — Z0000 Encounter for general adult medical examination without abnormal findings: Secondary | ICD-10-CM | POA: Diagnosis not present

## 2023-07-15 DIAGNOSIS — Z1331 Encounter for screening for depression: Secondary | ICD-10-CM | POA: Diagnosis not present

## 2023-07-15 DIAGNOSIS — Z23 Encounter for immunization: Secondary | ICD-10-CM | POA: Diagnosis not present

## 2023-07-15 DIAGNOSIS — Z860101 Personal history of adenomatous and serrated colon polyps: Secondary | ICD-10-CM | POA: Diagnosis not present

## 2023-07-22 ENCOUNTER — Other Ambulatory Visit (HOSPITAL_COMMUNITY)
Admission: RE | Admit: 2023-07-22 | Discharge: 2023-07-22 | Disposition: A | Discharge status: Home / Self Care | Payer: Self-pay | Source: Ambulatory Visit | Attending: Oncology | Admitting: Oncology

## 2023-07-22 DIAGNOSIS — Z006 Encounter for examination for normal comparison and control in clinical research program: Secondary | ICD-10-CM | POA: Insufficient documentation

## 2023-08-03 LAB — GENECONNECT MOLECULAR SCREEN: Genetic Analysis Overall Interpretation: NEGATIVE

## 2023-08-04 DIAGNOSIS — Z85828 Personal history of other malignant neoplasm of skin: Secondary | ICD-10-CM | POA: Diagnosis not present

## 2023-08-04 DIAGNOSIS — L821 Other seborrheic keratosis: Secondary | ICD-10-CM | POA: Diagnosis not present

## 2023-08-04 DIAGNOSIS — D2262 Melanocytic nevi of left upper limb, including shoulder: Secondary | ICD-10-CM | POA: Diagnosis not present

## 2023-08-04 DIAGNOSIS — L738 Other specified follicular disorders: Secondary | ICD-10-CM | POA: Diagnosis not present

## 2023-08-04 DIAGNOSIS — L814 Other melanin hyperpigmentation: Secondary | ICD-10-CM | POA: Diagnosis not present

## 2023-08-19 ENCOUNTER — Other Ambulatory Visit (HOSPITAL_COMMUNITY): Payer: Self-pay

## 2023-08-19 MED ORDER — VALACYCLOVIR HCL 1 G PO TABS
2000.0000 mg | ORAL_TABLET | Freq: Two times a day (BID) | ORAL | 1 refills | Status: AC
Start: 2023-08-19 — End: ?
  Filled 2023-08-19: qty 4, 1d supply, fill #0
  Filled 2023-08-20 – 2023-08-21 (×2): qty 4, 1d supply, fill #1

## 2023-08-21 ENCOUNTER — Other Ambulatory Visit (HOSPITAL_COMMUNITY): Payer: Self-pay

## 2023-11-30 ENCOUNTER — Other Ambulatory Visit (HOSPITAL_COMMUNITY): Payer: Self-pay

## 2023-12-01 ENCOUNTER — Other Ambulatory Visit (HOSPITAL_COMMUNITY): Payer: Self-pay

## 2023-12-01 MED ORDER — GABAPENTIN 100 MG PO CAPS
200.0000 mg | ORAL_CAPSULE | Freq: Every evening | ORAL | 0 refills | Status: DC
  Filled 2023-12-01: qty 180, 90d supply, fill #0

## 2023-12-07 ENCOUNTER — Other Ambulatory Visit: Payer: Self-pay

## 2023-12-07 ENCOUNTER — Other Ambulatory Visit (HOSPITAL_COMMUNITY): Payer: Self-pay

## 2024-01-12 DIAGNOSIS — Z1321 Encounter for screening for nutritional disorder: Secondary | ICD-10-CM | POA: Diagnosis not present

## 2024-01-12 DIAGNOSIS — Z1329 Encounter for screening for other suspected endocrine disorder: Secondary | ICD-10-CM | POA: Diagnosis not present

## 2024-01-12 DIAGNOSIS — Z131 Encounter for screening for diabetes mellitus: Secondary | ICD-10-CM | POA: Diagnosis not present

## 2024-01-12 DIAGNOSIS — Z13220 Encounter for screening for lipoid disorders: Secondary | ICD-10-CM | POA: Diagnosis not present

## 2024-01-12 DIAGNOSIS — Z13228 Encounter for screening for other metabolic disorders: Secondary | ICD-10-CM | POA: Diagnosis not present

## 2024-01-19 DIAGNOSIS — Z124 Encounter for screening for malignant neoplasm of cervix: Secondary | ICD-10-CM | POA: Diagnosis not present

## 2024-01-19 DIAGNOSIS — Z1231 Encounter for screening mammogram for malignant neoplasm of breast: Secondary | ICD-10-CM | POA: Diagnosis not present

## 2024-01-19 DIAGNOSIS — Z01419 Encounter for gynecological examination (general) (routine) without abnormal findings: Secondary | ICD-10-CM | POA: Diagnosis not present

## 2024-01-19 DIAGNOSIS — Z6824 Body mass index (BMI) 24.0-24.9, adult: Secondary | ICD-10-CM | POA: Diagnosis not present

## 2024-01-19 DIAGNOSIS — Z1151 Encounter for screening for human papillomavirus (HPV): Secondary | ICD-10-CM | POA: Diagnosis not present

## 2024-02-29 ENCOUNTER — Other Ambulatory Visit (HOSPITAL_COMMUNITY): Payer: Self-pay

## 2024-03-01 ENCOUNTER — Other Ambulatory Visit (HOSPITAL_COMMUNITY): Payer: Self-pay

## 2024-03-01 ENCOUNTER — Encounter (HOSPITAL_COMMUNITY): Payer: Self-pay

## 2024-03-01 MED ORDER — GABAPENTIN 100 MG PO CAPS
200.0000 mg | ORAL_CAPSULE | Freq: Every day | ORAL | 0 refills | Status: DC
  Filled 2024-03-01 – 2024-03-06 (×3): qty 180, 90d supply, fill #0
  Filled ????-??-??: fill #0

## 2024-03-06 ENCOUNTER — Other Ambulatory Visit (HOSPITAL_COMMUNITY): Payer: Self-pay

## 2024-03-06 ENCOUNTER — Other Ambulatory Visit: Payer: Self-pay

## 2024-05-22 ENCOUNTER — Encounter: Payer: Self-pay | Admitting: Gastroenterology

## 2024-05-22 ENCOUNTER — Other Ambulatory Visit (HOSPITAL_BASED_OUTPATIENT_CLINIC_OR_DEPARTMENT_OTHER): Payer: Self-pay

## 2024-05-22 ENCOUNTER — Other Ambulatory Visit (HOSPITAL_COMMUNITY): Payer: Self-pay

## 2024-05-22 MED ORDER — GABAPENTIN 100 MG PO CAPS
200.0000 mg | ORAL_CAPSULE | Freq: Every day | ORAL | 0 refills | Status: AC
  Filled 2024-05-22 (×2): qty 180, 90d supply, fill #0

## 2024-05-23 ENCOUNTER — Other Ambulatory Visit (HOSPITAL_COMMUNITY): Payer: Self-pay

## 2024-05-23 ENCOUNTER — Other Ambulatory Visit: Payer: Self-pay

## 2024-07-13 ENCOUNTER — Ambulatory Visit: Admitting: Gastroenterology
